# Patient Record
Sex: Female | Born: 1935
Health system: Southern US, Community
[De-identification: ages and names within clinical notes are randomized; demographics above are authoritative.]

## PROBLEM LIST (undated history)

## (undated) DIAGNOSIS — C50919 Malignant neoplasm of unspecified site of unspecified female breast: Secondary | ICD-10-CM

## (undated) DIAGNOSIS — I10 Essential (primary) hypertension: Secondary | ICD-10-CM

## (undated) DIAGNOSIS — R413 Other amnesia: Secondary | ICD-10-CM

## (undated) DIAGNOSIS — G8929 Other chronic pain: Secondary | ICD-10-CM

## (undated) DIAGNOSIS — M545 Low back pain, unspecified: Secondary | ICD-10-CM

## (undated) DIAGNOSIS — C9110 Chronic lymphocytic leukemia of B-cell type not having achieved remission: Secondary | ICD-10-CM

## (undated) DIAGNOSIS — E860 Dehydration: Secondary | ICD-10-CM

## (undated) HISTORY — DX: Chronic lymphocytic leukemia of B-cell type not having achieved remission: C91.10

## (undated) HISTORY — DX: Other chronic pain: G89.29

## (undated) HISTORY — DX: Low back pain, unspecified: M54.50

## (undated) HISTORY — PX: MASTECTOMY: SHX3

## (undated) HISTORY — DX: Essential (primary) hypertension: I10

---

## 2019-03-18 DIAGNOSIS — S3210XA Unspecified fracture of sacrum, initial encounter for closed fracture: Secondary | ICD-10-CM

## 2019-03-18 HISTORY — DX: Unspecified fracture of sacrum, initial encounter for closed fracture: S32.10XA

## 2019-04-08 ENCOUNTER — Other Ambulatory Visit: Payer: Self-pay

## 2019-04-08 ENCOUNTER — Emergency Department (HOSPITAL_BASED_OUTPATIENT_CLINIC_OR_DEPARTMENT_OTHER)
Admission: EM | Admit: 2019-04-08 | Discharge: 2019-04-08 | Disposition: A | Discharge status: Home / Self Care | Payer: Medicare Other | Attending: Emergency Medicine | Admitting: Emergency Medicine

## 2019-04-08 ENCOUNTER — Emergency Department (HOSPITAL_BASED_OUTPATIENT_CLINIC_OR_DEPARTMENT_OTHER): Payer: Medicare Other

## 2019-04-08 ENCOUNTER — Encounter (HOSPITAL_BASED_OUTPATIENT_CLINIC_OR_DEPARTMENT_OTHER): Payer: Self-pay

## 2019-04-08 DIAGNOSIS — Z9010 Acquired absence of unspecified breast and nipple: Secondary | ICD-10-CM | POA: Diagnosis not present

## 2019-04-08 DIAGNOSIS — Y929 Unspecified place or not applicable: Secondary | ICD-10-CM | POA: Diagnosis not present

## 2019-04-08 DIAGNOSIS — S3992XA Unspecified injury of lower back, initial encounter: Secondary | ICD-10-CM | POA: Insufficient documentation

## 2019-04-08 DIAGNOSIS — Z853 Personal history of malignant neoplasm of breast: Secondary | ICD-10-CM | POA: Insufficient documentation

## 2019-04-08 DIAGNOSIS — Y999 Unspecified external cause status: Secondary | ICD-10-CM | POA: Insufficient documentation

## 2019-04-08 DIAGNOSIS — Y9301 Activity, walking, marching and hiking: Secondary | ICD-10-CM | POA: Diagnosis not present

## 2019-04-08 DIAGNOSIS — W010XXA Fall on same level from slipping, tripping and stumbling without subsequent striking against object, initial encounter: Secondary | ICD-10-CM | POA: Insufficient documentation

## 2019-04-08 HISTORY — DX: Malignant neoplasm of unspecified site of unspecified female breast: C50.919

## 2019-04-08 HISTORY — DX: Other amnesia: R41.3

## 2019-04-08 HISTORY — DX: Dehydration: E86.0

## 2019-04-08 MED ORDER — HYDROCODONE-ACETAMINOPHEN 5-325 MG PO TABS: 1.0000 | ORAL_TABLET | Freq: Four times a day (QID) | ORAL | 0 refills | Status: DC | PRN

## 2019-04-08 NOTE — ED Notes (Signed)
Provider at bedside to discuss results.

## 2019-04-08 NOTE — ED Notes (Signed)
Patient transported to CT 

## 2019-04-08 NOTE — ED Notes (Signed)
Assisted to bathroom via wheelchair prior to discharge.  Pt utilizing personal wheelchair and inflatable donut cusion

## 2019-04-08 NOTE — ED Provider Notes (Signed)
[facility] EMERGENCY DEPARTMENT Provider Note   CSN: [id number] Arrival date & time: 04/08/19  1437     History   Chief Complaint Chief Complaint  Patient presents with  . Fall    HPI Joan Hill is a 85 y.o. female brought in by her son-in-law.  Patient was carrying her plates when she turned and fell backward onto her tailbone.  This happened approximately 2 and half weeks ago.  Since that time she has had significant pain anytime she moves or changes position.  She is better at rest.  She has been able to ambulate but is complaining of pain in her tailbone and low back.  She has no new weakness in the lower extremities.     HPI  Past Medical History:  Diagnosis Date  . Breast cancer ([place])   . Dehydration   . Memory loss     There are no active problems to display for this patient.   Past Surgical History:  Procedure Laterality Date  . MASTECTOMY       OB History   No obstetric history on file.      Home Medications    Prior to Admission medications   Not on File    Family History No family history on file.  Social History Social History   Tobacco Use  . Smoking status: Never Smoker  . Smokeless tobacco: Never Used  Substance Use Topics  . Alcohol use: Never    Frequency: Never  . Drug use: Never     Allergies   Patient has no known allergies.   Review of Systems Review of Systems  Unable to perform ROS: Dementia      Physical Exam Updated Vital Signs BP 110/61 (BP Location: Left Arm)   Pulse 97   Temp 98.8 F (37.1 C) (Oral)   Resp 20   SpO2 94%   Physical Exam Vitals signs and nursing note reviewed.  Constitutional:      General: She is not in acute distress.    Appearance: She is well-developed. She is not diaphoretic.  HENT:     Head: Normocephalic and atraumatic.  Eyes:     General: No scleral icterus.    Conjunctiva/sclera: Conjunctivae normal.  Neck:     Musculoskeletal: Normal range of motion.   Cardiovascular:     Rate and Rhythm: Normal rate and regular rhythm.     Heart sounds: Normal heart sounds. No murmur. No friction rub. No gallop.   Pulmonary:     Effort: Pulmonary effort is normal. No respiratory distress.     Breath sounds: Normal breath sounds.  Abdominal:     General: Bowel sounds are normal. There is no distension.     Palpations: Abdomen is soft. There is no mass.     Tenderness: There is no abdominal tenderness. There is no guarding.  Musculoskeletal:     Comments: Normal strength of the bilateral lower extremities.  No pain with internal or external rotation of the hips bilaterally.  Pain with pressure on the iliac crests.  Tenderness over the right SI joint, no tenderness over the sacrum or midline spinal tenderness, tenderness over the right lumbar paraspinals.  Skin:    General: Skin is warm and dry.  Neurological:     Mental Status: She is alert and oriented to person, place, and time.  Psychiatric:        Behavior: Behavior normal.      ED Treatments / Results  Labs (all  labs ordered are listed, but only abnormal results are displayed) Labs Reviewed - No data to display  EKG None  Radiology No results found.  Procedures Procedures (including critical care time)  Medications Ordered in ED Medications - No data to display   Initial Impression / Assessment and Plan / ED Course  I have reviewed the triage vital signs and the nursing notes.  Pertinent labs & imaging results that were available during my care of the patient were reviewed by me and considered in my medical decision making (see chart for details).        85 year old female here with complaint of pain after fall onto her sacrum.  I personally reviewed the hip, lumbar spine and sacral and coccyx plain films which shows a sacral fracture on the lateral view by my interpretation.  I also ordered a CT as recommended by radiology which again confirms a sacral fracture by my  interpretation.  Patient has a walker and a wheelchair at home.  Her son-in-law has asked that we get her better pain control.  I have ordered Norco, I have suggested that she gets stool softeners.  She is to follow closely with her primary care doctor.  Final Clinical Impressions(s) / ED Diagnoses   Final diagnoses:  None    ED Discharge Orders    None       Margarita Mail, PA-C 04/08/19 1842    Virgel Manifold, MD 04/09/19 1005

## 2019-04-08 NOTE — Discharge Instructions (Addendum)
Contact a health care provider if: Your pain becomes worse or is not controlled with medicine. Your bowel movements cause a great deal of discomfort. You are unable to have a bowel movement after 4 days. You have pain during intercourse. 

## 2019-04-08 NOTE — ED Triage Notes (Signed)
Per son-in-law pt fell the first week of Oct-"backwards onto tailbone"-has not sought medical tx-pt to triage in w/c

## 2019-04-14 ENCOUNTER — Telehealth: Payer: Self-pay | Admitting: Family

## 2019-04-14 NOTE — Telephone Encounter (Signed)
Copied from [place] [phone]. Topic: General - Other >> Apr 14, 2019 12:11   Alanda Slim E wrote: Reason for CRM: Henrietta Dine called and asked about the Pts HYDROcodone-acetaminophen (NORCO) 5-325 MG tablet   that was given by the med center when she was given a CT scan for a fractured tail bone? Pt will run out of medication before her next appt with Melissa and he is asking if she can receive a refill/ please advise

## 2019-04-14 NOTE — Telephone Encounter (Signed)
I can't prescribe a narcotic without seeing her first per [place] prescribing laws. Ok to try to move her appointment up sooner though.

## 2019-04-15 NOTE — Telephone Encounter (Signed)
Called patient again, still no answer.  

## 2019-04-15 NOTE — Telephone Encounter (Signed)
Called number listed for patient several times but no answer and no voice mail. No MyChart account set up either.  Will try again later.

## 2019-04-16 NOTE — Telephone Encounter (Signed)
Called patient again, still no answer, she has not called back about this matter either.

## 2019-04-20 ENCOUNTER — Other Ambulatory Visit: Payer: Self-pay

## 2019-04-21 ENCOUNTER — Encounter: Payer: Self-pay | Admitting: Family

## 2019-04-21 ENCOUNTER — Telehealth: Payer: Self-pay | Admitting: *Deleted

## 2019-04-21 ENCOUNTER — Ambulatory Visit (INDEPENDENT_AMBULATORY_CARE_PROVIDER_SITE_OTHER): Payer: Medicare Other | Admitting: Family

## 2019-04-21 VITALS — BP 106/59 | HR 94 | Temp 97.3°F | Resp 16 | Ht 64.0 in | Wt 113.0 lb

## 2019-04-21 DIAGNOSIS — I10 Essential (primary) hypertension: Secondary | ICD-10-CM

## 2019-04-21 DIAGNOSIS — G894 Chronic pain syndrome: Secondary | ICD-10-CM

## 2019-04-21 DIAGNOSIS — M545 Low back pain, unspecified: Secondary | ICD-10-CM

## 2019-04-21 DIAGNOSIS — C9110 Chronic lymphocytic leukemia of B-cell type not having achieved remission: Secondary | ICD-10-CM

## 2019-04-21 DIAGNOSIS — R413 Other amnesia: Secondary | ICD-10-CM

## 2019-04-21 DIAGNOSIS — S3210XD Unspecified fracture of sacrum, subsequent encounter for fracture with routine healing: Secondary | ICD-10-CM

## 2019-04-21 DIAGNOSIS — G8929 Other chronic pain: Secondary | ICD-10-CM

## 2019-04-21 LAB — CBC WITH DIFFERENTIAL/PLATELET
Basophils Absolute: 0.1 10*3/uL (ref 0.0–0.1)
Basophils Relative: 0.4 % (ref 0.0–3.0)
Eosinophils Absolute: 0.2 10*3/uL (ref 0.0–0.7)
Eosinophils Relative: 0.8 % (ref 0.0–5.0)
HCT: 35.3 % — ABNORMAL LOW (ref 36.0–46.0)
Hemoglobin: 11.1 g/dL — ABNORMAL LOW (ref 12.0–15.0)
Lymphocytes Relative: 63.9 % — ABNORMAL HIGH (ref 12.0–46.0)
Lymphs Abs: 20.5 10*3/uL — ABNORMAL HIGH (ref 0.7–4.0)
MCHC: 31.4 g/dL (ref 30.0–36.0)
MCV: 93 fl (ref 78.0–100.0)
Monocytes Absolute: 1.3 10*3/uL — ABNORMAL HIGH (ref 0.1–1.0)
Monocytes Relative: 4 % (ref 3.0–12.0)
Neutro Abs: 9.9 10*3/uL — ABNORMAL HIGH (ref 1.4–7.7)
Neutrophils Relative %: 30.9 % — ABNORMAL LOW (ref 43.0–77.0)
Platelets: 226 10*3/uL (ref 150.0–400.0)
RBC: 3.8 Mil/uL — ABNORMAL LOW (ref 3.87–5.11)
RDW: 15.4 % (ref 11.5–15.5)
WBC: 32.1 10*3/uL (ref 4.0–10.5)

## 2019-04-21 LAB — COMPREHENSIVE METABOLIC PANEL
ALT: 12 U/L (ref 0–35)
AST: 14 U/L (ref 0–37)
Albumin: 3.4 g/dL — ABNORMAL LOW (ref 3.5–5.2)
Alkaline Phosphatase: 102 U/L (ref 39–117)
BUN: 21 mg/dL (ref 6–23)
CO2: 28 mEq/L (ref 19–32)
Calcium: 9 mg/dL (ref 8.4–10.5)
Chloride: 103 mEq/L (ref 96–112)
Creatinine, Ser: 0.8 mg/dL (ref 0.40–1.20)
GFR: 67.52 mL/min (ref >60.00)
Glucose, Bld: 117 mg/dL — ABNORMAL HIGH (ref 70–99)
Potassium: 4.4 mEq/L (ref 3.5–5.1)
Sodium: 136 mEq/L (ref 135–145)
Total Bilirubin: 0.3 mg/dL (ref 0.2–1.2)
Total Protein: 6.3 g/dL (ref 6.0–8.3)

## 2019-04-21 MED ORDER — TRAMADOL HCL 50 MG PO TABS: 100.0000 mg | ORAL_TABLET | Freq: Two times a day (BID) | ORAL | 0 refills | Status: DC

## 2019-04-21 NOTE — Patient Instructions (Addendum)
Please complete lab work prior to leaving.  Stop Ramipril. Welcome to [facility]!

## 2019-04-21 NOTE — Progress Notes (Signed)
Subjective:    Patient ID: Joan Hill, female    DOB: [birth year], 85 y.o.   MRN: [id number]  HPI  Patient is an 85 year old female who presents today to establish care.  She was previously followed by Dr. Drake Leach at another practice.  She is accompanied today by her son-in-law who along with her daughter serves as her caregivers.  She lives with them in their home and has lived with them for the last 3 years.  Son notes that patient has issues with memory.  1 month ago she had a fall at home.  Apparently she was turning and lost her balance.  She fell on her coccyx.  She had severe pain with walking and sitting following this injury.  Her son-in-law brought her to the med center ED for further evaluation where they performed a CT scan of her sacrum which noted an acute sacral fracture.  Initially she was having significant pain which was requiring hydrocodone.  Son-in-law reports that her pain is now significantly improved.  She is off of the hydrocodone and is on tramadol 100 mg twice daily which is a long-term medicine that she takes for chronic pain in her lower back.  He states that her pain originates from history of scoliosis.  They are also alternating Tylenol with Motrin as needed for pain.  She is able to ambulate short distances with a cane and supervision.  She complains of dizziness in the AM.   BP Readings from Last 3 Encounters:  04/21/19 (!) 106/59  04/08/19 120/68   CLL- sees hematology.  She has not seen them since 2018.   Memory loss- per son-in-law, this is at baseline.   Review of Systems  Constitutional: Negative for unexpected weight change.  HENT: Negative for hearing loss and rhinorrhea.   Eyes: Negative for visual disturbance.  Respiratory: Negative for cough and shortness of breath.   Cardiovascular: Negative for chest pain.  Gastrointestinal: Negative for constipation and diarrhea.  Genitourinary: Negative for dysuria and frequency.   Musculoskeletal: Positive for back pain.  Neurological: Negative for headaches.  Hematological: Negative for adenopathy.  Psychiatric/Behavioral:       Denies depression/anxiety   Past Medical History:  Diagnosis Date  . Breast cancer ([place])   . Dehydration   . Memory loss      Social History   Socioeconomic History  . Marital status: Widowed    Spouse name: Not on file  . Number of children: Not on file  . Years of education: Not on file  . Highest education level: Not on file  Occupational History  . Not on file  Social Needs  . Financial resource strain: Not on file  . Food insecurity    Worry: Not on file    Inability: Not on file  . Transportation needs    Medical: Not on file    Non-medical: Not on file  Tobacco Use  . Smoking status: Never Smoker  . Smokeless tobacco: Never Used  Substance and Sexual Activity  . Alcohol use: Not Currently    Frequency: Never  . Drug use: Never  . Sexual activity: Not Currently  Lifestyle  . Physical activity    Days per week: Not on file    Minutes per session: Not on file  . Stress: Not on file  Relationships  . Social Herbalist on phone: Not on file    Gets together: Not on file    Attends religious service: Not  on file    Active member of club or organization: Not on file    Attends meetings of clubs or organizations: Not on file    Relationship status: Not on file  . Intimate partner violence    Fear of current or ex partner: Not on file    Emotionally abused: Not on file    Physically abused: Not on file    Forced sexual activity: Not on file  Other Topics Concern  . Not on file  Social History Narrative   Lives with daughter and son in law   Has 2 daughters and 2 sons, oldest son is deceased, other children live locally.     Past Surgical History:  Procedure Laterality Date  . MASTECTOMY      History reviewed. No pertinent family history.  No Known Allergies  Current Outpatient  Medications on File Prior to Visit  Medication Sig Dispense Refill  . traMADol (ULTRAM) 50 MG tablet TAKE 2 TABLETS BY MOUTH IN THE MORNING AND 2 TABLETS AT BEDTIME     No current facility-administered medications on file prior to visit.     BP (!) 106/59 (BP Location: Left Arm, Patient Position: Sitting, Cuff Size: Small)   Pulse 94   Temp (!) 97.3 F (36.3 C) (Temporal)   Resp 16   Ht 5\' 4"  (1.626 m)   Wt 113 lb (51.3 kg)   SpO2 94%   BMI 19.40 kg/m        Objective:   Physical Exam Constitutional:      Appearance: She is well-developed.  HENT:     Head: Normocephalic and atraumatic.  Eyes:     Conjunctiva/sclera: Conjunctivae normal.  Neck:     Musculoskeletal: Neck supple.     Thyroid: No thyromegaly.  Cardiovascular:     Rate and Rhythm: Normal rate and regular rhythm.     Heart sounds: Normal heart sounds. No murmur.  Pulmonary:     Effort: Pulmonary effort is normal. No respiratory distress.     Breath sounds: Normal breath sounds. No wheezing.  Abdominal:     General: Bowel sounds are normal.     Palpations: Abdomen is soft.     Tenderness: There is no abdominal tenderness.  Musculoskeletal:     Right lower leg: 1+ Edema present.     Left lower leg: 2+ Edema present.  Lymphadenopathy:     Cervical: No cervical adenopathy.  Skin:    General: Skin is warm and dry.  Neurological:     Mental Status: She is alert.     Comments: Some difficulty hearing Memory seems impaired.   Psychiatric:        Mood and Affect: Mood normal.        Speech: Speech normal.        Behavior: Behavior normal.        Thought Content: Thought content normal.        Judgment: Judgment normal.           Assessment & Plan:  CLL- WBC up compared to baseline when I reviewed records from [facility].  I reviewed the case with her hematologist, Dr. Baird Cancer.  He is not overly concerned as she is feeling well.  He would like to get her in to be seen this month and states his  office will work on getting her scheduled.   Lab Results  Component Value Date   WBC 32.1 Repeated and verified X2. (HH) 04/21/2019   HGB 11.1 (  L) 04/21/2019   HCT 35.3 (L) 04/21/2019   MCV 93.0 04/21/2019   PLT 226.0 04/21/2019   Sacral fracture- pain is improved.  She is ambulating with assistance.  Monitor.   Chronic low back pain- reviewed [place] Controlled substance registry, UDS will be performed and controlled substance contract signed.  Continue tramadol 100mg  bid.    HTN- overtreated.  I am concerned that her she may be having some orthostasis which is contributing to her dizziness and falls. Will d/c ramipril.  Memory loss- at baseline, could consider referral to neurology at some point if patient and family are interested.

## 2019-04-21 NOTE — Telephone Encounter (Signed)
CRITICAL VALUE STICKER  CRITICAL VALUE:  WBC  32.1  RECEIVER (on-site recipient of call):  Kelle Darting, [place] NOTIFIED:  04/21/19 @ 4:07pm  MESSENGER (representative from lab):  Shari Prows.  MD NOTIFIED:  Lenna Sciara / Rod Holler  TIME OF NOTIFICATION: 4:07pm  RESPONSE:

## 2019-04-22 ENCOUNTER — Telehealth: Payer: Self-pay | Admitting: Family

## 2019-04-22 ENCOUNTER — Encounter: Payer: Self-pay | Admitting: Family

## 2019-04-22 DIAGNOSIS — G8929 Other chronic pain: Secondary | ICD-10-CM | POA: Insufficient documentation

## 2019-04-22 DIAGNOSIS — R413 Other amnesia: Secondary | ICD-10-CM | POA: Insufficient documentation

## 2019-04-22 DIAGNOSIS — I10 Essential (primary) hypertension: Secondary | ICD-10-CM | POA: Insufficient documentation

## 2019-04-22 DIAGNOSIS — C9110 Chronic lymphocytic leukemia of B-cell type not having achieved remission: Secondary | ICD-10-CM | POA: Insufficient documentation

## 2019-04-22 NOTE — Telephone Encounter (Signed)
Please contact pt's son in law and let him know that her white blood cell count is up. I spoke with Dr.George Baird Cancer her hematologist and he would like to get her back in to be seen this month.  His office should be reaching out to them to arrange. If they don't hear from them please ask them to call his office.

## 2019-04-22 NOTE — Telephone Encounter (Signed)
Results reviewed with her hematologist.

## 2019-04-22 NOTE — Telephone Encounter (Signed)
Patient's family made aware of results and discussion with hematology. They will be aware of hematology calling for appointment

## 2019-04-22 NOTE — Telephone Encounter (Signed)
Spoke to patient's son in [place] and advised him of abnormal results. He was made aware he will receive a call from hematology for appointment and to call them if no call by tomorrow.

## 2019-04-24 LAB — DRUG TOX MONITOR 1 W/CONF, ORAL FLD

## 2019-05-23 ENCOUNTER — Other Ambulatory Visit: Payer: Self-pay | Admitting: Family

## 2019-05-26 ENCOUNTER — Ambulatory Visit: Payer: Medicare Other | Admitting: Family

## 2019-05-27 ENCOUNTER — Other Ambulatory Visit: Payer: Self-pay

## 2019-05-28 ENCOUNTER — Ambulatory Visit (INDEPENDENT_AMBULATORY_CARE_PROVIDER_SITE_OTHER): Payer: Medicare Other | Admitting: Family

## 2019-05-28 DIAGNOSIS — C9110 Chronic lymphocytic leukemia of B-cell type not having achieved remission: Secondary | ICD-10-CM

## 2019-05-28 DIAGNOSIS — I10 Essential (primary) hypertension: Secondary | ICD-10-CM

## 2019-05-28 DIAGNOSIS — G8929 Other chronic pain: Secondary | ICD-10-CM

## 2019-05-28 DIAGNOSIS — M545 Low back pain, unspecified: Secondary | ICD-10-CM

## 2019-05-28 NOTE — Progress Notes (Signed)
Virtual Visit via Video Note  I connected with Aroush Chasse on 05/28/19 at  4:00 PM EST by a video enabled telemedicine application and verified that I am speaking with the correct person using two identifiers.  Location: Patient: home Provider: home   I discussed the limitations of evaluation and management by telemedicine and the availability of in person appointments. The patient expressed understanding and agreed to proceed.  History of Present Illness:  Patient is an 85 yr old female who presents today for follow up. We met her for the first time last visit. She is with her daughter-in-law for today's visit.   CLL- Last visit her wbc was noted to be elevated and was reviewed with her hematologist.  She saw him in follow up on 05/26/19. WBC at that visit was 24K.  He recommended a 6 month follow up as she was asymptomatic.   HTN- Last visit her bp appeared overtreated (she was experiencing dizziness and falls). We discontinued ramipril.  (bp 124/69) on 12/9 at her hematologist's office.  BP Readings from Last 3 Encounters:  04/21/19 (!) 106/59  04/08/19 120/68   Chronic low back pain- continue tramadol 100 mg bid.   Past Medical History:  Diagnosis Date  . Breast cancer ([place])   . Chronic low back pain   . CLL (chronic lymphocytic leukemia) ([place])   . Dehydration   . Hypertension   . Memory loss   . Sacral fracture ([place]) 03/2019     Social History   Socioeconomic History  . Marital status: Widowed    Spouse name: Not on file  . Number of children: Not on file  . Years of education: Not on file  . Highest education level: Not on file  Occupational History  . Not on file  Tobacco Use  . Smoking status: Never Smoker  . Smokeless tobacco: Never Used  Substance and Sexual Activity  . Alcohol use: Not Currently  . Drug use: Never  . Sexual activity: Not Currently  Other Topics Concern  . Not on file  Social History Narrative   Lives with daughter and son in law   Has 2 daughters and 2 sons, oldest son is deceased, other children live locally.    Social Determinants of Health   Financial Resource Strain:   . Difficulty of Paying Living Expenses: Not on file  Food Insecurity:   . Worried About Charity fundraiser in the Last Year: Not on file  . Ran Out of Food in the Last Year: Not on file  Transportation Needs:   . Lack of Transportation (Medical): Not on file  . Lack of Transportation (Non-Medical): Not on file  Physical Activity:   . Days of Exercise per Week: Not on file  . Minutes of Exercise per Session: Not on file  Stress:   . Feeling of Stress : Not on file  Social Connections:   . Frequency of Communication with Friends and Family: Not on file  . Frequency of Social Gatherings with Friends and Family: Not on file  . Attends Religious Services: Not on file  . Active Member of Clubs or Organizations: Not on file  . Attends Archivist Meetings: Not on file  . Marital Status: Not on file  Intimate Partner Violence:   . Fear of Current or Ex-Partner: Not on file  . Emotionally Abused: Not on file  . Physically Abused: Not on file  . Sexually Abused: Not on file    Past Surgical  History:  Procedure Laterality Date  . MASTECTOMY      No family history on file.  No Known Allergies  Current Outpatient Medications on File Prior to Visit  Medication Sig Dispense Refill  . traMADol (ULTRAM) 50 MG tablet TAKE 2 TABLETS (100 MG TOTAL) BY MOUTH 2 (TWO) TIMES DAILY. 120 tablet 0   No current facility-administered medications on file prior to visit.    There were no vitals taken for this visit.     Observations/Objective:   Gen: Awake, alert, no acute distress Resp: Breathing is even and non-labored Psych: calm/pleasant demeanor Neuro: Alert and Oriented x 3, + facial symmetry, speech is clear.   Assessment and Plan:  CLL- clinically stable. Following with hematology.  Chronic back pain- stable on tramadol,  continue dame.  HTN- bp looks improved off of ramapril, continue off of ramapril.   Follow Up Instructions:    I discussed the assessment and treatment plan with the patient. The patient was provided an opportunity to ask questions and all were answered. The patient agreed with the plan and demonstrated an understanding of the instructions.   The patient was advised to call back or seek an in-person evaluation if the symptoms worsen or if the condition fails to improve as anticipated.  Nance Pear, NP

## 2019-06-26 ENCOUNTER — Other Ambulatory Visit: Payer: Self-pay | Admitting: Family

## 2019-07-25 ENCOUNTER — Other Ambulatory Visit: Payer: Self-pay | Admitting: Family

## 2019-07-27 NOTE — Telephone Encounter (Signed)
Last Tramadol RX:  06/28/19, #120 Last OV:  05/27/20 Next OV:  Not scheduled UDS:  04/21/19 CSC:  04/21/19

## 2019-08-26 ENCOUNTER — Other Ambulatory Visit: Payer: Self-pay | Admitting: Family

## 2019-08-26 NOTE — Telephone Encounter (Signed)
Requesting:Tramadol Contract: 05/19/2019 UDS:04/21/2019 Last Visit:05/28/2019 Next Visit:none Last Refill:07/28/2019  Please Advise

## 2019-09-26 ENCOUNTER — Other Ambulatory Visit: Payer: Self-pay | Admitting: Family

## 2019-10-30 ENCOUNTER — Other Ambulatory Visit: Payer: Self-pay | Admitting: Family

## 2019-11-29 ENCOUNTER — Telehealth: Payer: Self-pay | Admitting: Family

## 2019-11-29 NOTE — Telephone Encounter (Signed)
Lvm for patient to call back about refill and to schedule follow up appointment

## 2019-11-29 NOTE — Telephone Encounter (Signed)
Patient is requesting extra refills.  Medication: traMADol (ULTRAM) 50 MG tablet [[id number]]      Has the patient contacted their pharmacy? yes (If no, request that the patient contact the pharmacy for the refill.) (If yes, when and what did the pharmacy advise?) contact doctor     Preferred Pharmacy (with phone number or street name): [facility], [place], [place] [address]  Phone:  [phone] Fax:  [phone]      Agent: Please be advised that RX refills may take up to 3 business days. We ask that you follow-up with your pharmacy.

## 2019-11-30 ENCOUNTER — Telehealth: Payer: Self-pay

## 2019-11-30 MED ORDER — TRAMADOL HCL 50 MG PO TABS: 100.0000 mg | ORAL_TABLET | Freq: Two times a day (BID) | ORAL | 0 refills | Status: DC

## 2019-11-30 NOTE — Telephone Encounter (Signed)
Spoke to patient's son Waunita Schooner, patient was scheduled for follow up 12-14-19

## 2019-11-30 NOTE — Telephone Encounter (Signed)
Caller states the nurse- Rod Holler called about his mother in laws medication /refills -- but left no more details -- caller stats she sees the PA [place] states he is on his way into a meeting and it will be late/ we can callback in the morning

## 2019-11-30 NOTE — Telephone Encounter (Signed)
Spoke to patient's son Joan Hill and appointment was set up for 12-14-19

## 2019-11-30 NOTE — Telephone Encounter (Signed)
Refill sent, however she is due for follow up. Please schedule follow up visit.

## 2019-12-14 ENCOUNTER — Other Ambulatory Visit: Payer: Self-pay

## 2019-12-14 ENCOUNTER — Encounter: Payer: Self-pay | Admitting: Family

## 2019-12-14 ENCOUNTER — Ambulatory Visit (INDEPENDENT_AMBULATORY_CARE_PROVIDER_SITE_OTHER): Payer: Medicare Other | Admitting: Family

## 2019-12-14 VITALS — BP 145/66 | HR 94 | Temp 97.8°F | Resp 16 | Ht 64.0 in | Wt 113.0 lb

## 2019-12-14 DIAGNOSIS — G8929 Other chronic pain: Secondary | ICD-10-CM

## 2019-12-14 DIAGNOSIS — M549 Dorsalgia, unspecified: Secondary | ICD-10-CM

## 2019-12-14 NOTE — Progress Notes (Signed)
Subjective:    Patient ID: Joan Hill, female    DOB: [birth year], 86 y.o.   MRN: [id number]  HPI  Patient is an 86 yr old female who presents today for follow up of her chronic back pain.   Daughter reports that patient's pain is on/off.  She uses tramadol 100mg  twice daily.  Patient has dementia and but when I ask her if she is having any pain she says "I have no pain at all."     Review of Systems    see HPI  Past Medical History:  Diagnosis Date  . Breast cancer ([place])   . Chronic low back pain   . CLL (chronic lymphocytic leukemia) ([place])   . Dehydration   . Hypertension   . Memory loss   . Sacral fracture ([place]) 03/2019     Social History   Socioeconomic History  . Marital status: Widowed    Spouse name: Not on file  . Number of children: Not on file  . Years of education: Not on file  . Highest education level: Not on file  Occupational History  . Not on file  Tobacco Use  . Smoking status: Never Smoker  . Smokeless tobacco: Never Used  Vaping Use  . Vaping Use: Never used  Substance and Sexual Activity  . Alcohol use: Not Currently  . Drug use: Never  . Sexual activity: Not Currently  Other Topics Concern  . Not on file  Social History Narrative   Lives with daughter and son in law   Has 2 daughters and 2 sons, oldest son is deceased, other children live locally.    Social Determinants of Health   Financial Resource Strain:   . Difficulty of Paying Living Expenses:   Food Insecurity:   . Worried About Charity fundraiser in the Last Year:   . Arboriculturist in the Last Year:   Transportation Needs:   . Film/video editor (Medical):   Marland Kitchen Lack of Transportation (Non-Medical):   Physical Activity:   . Days of Exercise per Week:   . Minutes of Exercise per Session:   Stress:   . Feeling of Stress :   Social Connections:   . Frequency of Communication with Friends and Family:   . Frequency of Social Gatherings with Friends and Family:   .  Attends Religious Services:   . Active Member of Clubs or Organizations:   . Attends Archivist Meetings:   Marland Kitchen Marital Status:   Intimate Partner Violence:   . Fear of Current or Ex-Partner:   . Emotionally Abused:   Marland Kitchen Physically Abused:   . Sexually Abused:     Past Surgical History:  Procedure Laterality Date  . MASTECTOMY      No family history on file.  No Known Allergies  Current Outpatient Medications on File Prior to Visit  Medication Sig Dispense Refill  . traMADol (ULTRAM) 50 MG tablet Take 2 tablets (100 mg total) by mouth 2 (two) times daily. 120 tablet 0   No current facility-administered medications on file prior to visit.    BP (!) 145/66 (BP Location: Right Arm, Patient Position: Sitting, Cuff Size: Small)   Pulse 94   Temp 97.8 F (36.6 C) (Temporal)   Resp 16   Ht 5\' 4"  (1.626 m)   Wt 113 lb (51.3 kg)   SpO2 96%   BMI 19.40 kg/m    Objective:   Physical Exam Constitutional:  Appearance: She is well-developed.  Cardiovascular:     Rate and Rhythm: Normal rate and regular rhythm.     Heart sounds: Normal heart sounds. No murmur heard.   Pulmonary:     Effort: Pulmonary effort is normal. No respiratory distress.     Breath sounds: Normal breath sounds. No wheezing.  Psychiatric:        Behavior: Behavior normal.        Thought Content: Thought content normal.        Judgment: Judgment normal.           Assessment & Plan:  Chronic back pain- I am no sure how much she is needing the pain medicine at this point. Discussed the following with her daughter:  Please decrease tramadol to 1 tablet twice daily.  Try this for a few weeks. If no pain, please change to 1 tab twice daily as needed.   This visit occurred during the SARS-CoV-2 public health emergency.  Safety protocols were in place, including screening questions prior to the visit, additional usage of staff PPE, and extensive cleaning of exam room while observing  appropriate contact time as indicated for disinfecting solutions.

## 2019-12-14 NOTE — Patient Instructions (Signed)
Please decrease tramadol to 1 tablet twice daily.  Try this for a few weeks. If no pain, please change to 1 tab twice daily as needed.

## 2019-12-26 ENCOUNTER — Other Ambulatory Visit: Payer: Self-pay | Admitting: Family

## 2019-12-27 NOTE — Telephone Encounter (Signed)
Refill sent.  Please ask family how she is doing on the 1 tab of tramadol bid (pain wise).

## 2019-12-27 NOTE — Telephone Encounter (Signed)
Per Pharmacy, Patient is not doing well with decreased dosage. Pt would like to go back to regular dosage.   Please Advise

## 2019-12-28 MED ORDER — TRAMADOL HCL 50 MG PO TABS: 100.0000 mg | ORAL_TABLET | Freq: Two times a day (BID) | ORAL | 0 refills | Status: DC

## 2019-12-28 NOTE — Telephone Encounter (Signed)
LM requesting call back to discuss medication change/dosage.

## 2019-12-28 NOTE — Telephone Encounter (Signed)
rx resent with updated instructions and message to pharmacy to cancel yesterday's rx.  I confirmed in [place] aware that yesterday's rx has not yet been filled.

## 2019-12-28 NOTE — Telephone Encounter (Signed)
Spoke with daughter, Arrie Aran.  Dawn states patient decreased dose of Tramadol 50mg  to one tablet twice daily for 2 days, but was unable to ambulate d/t pain. Daughter has increased back to original dose of 2 tablets twice a day, therefore needs prescription/refill changed if possible.

## 2020-01-29 ENCOUNTER — Other Ambulatory Visit: Payer: Self-pay | Admitting: Family

## 2020-02-28 ENCOUNTER — Other Ambulatory Visit: Payer: Self-pay | Admitting: Family

## 2020-03-17 ENCOUNTER — Ambulatory Visit: Payer: Medicare Other | Admitting: Family

## 2020-03-21 ENCOUNTER — Other Ambulatory Visit: Payer: Self-pay

## 2020-03-21 ENCOUNTER — Ambulatory Visit (INDEPENDENT_AMBULATORY_CARE_PROVIDER_SITE_OTHER): Payer: Medicare Other | Admitting: Family

## 2020-03-21 ENCOUNTER — Encounter: Payer: Self-pay | Admitting: Family

## 2020-03-21 VITALS — BP 128/62 | HR 101 | Temp 98.1°F | Resp 16 | Ht 64.0 in | Wt 109.0 lb

## 2020-03-21 DIAGNOSIS — M545 Low back pain, unspecified: Secondary | ICD-10-CM

## 2020-03-21 DIAGNOSIS — Z23 Encounter for immunization: Secondary | ICD-10-CM

## 2020-03-21 DIAGNOSIS — C9110 Chronic lymphocytic leukemia of B-cell type not having achieved remission: Secondary | ICD-10-CM

## 2020-03-21 DIAGNOSIS — G309 Alzheimer's disease, unspecified: Secondary | ICD-10-CM

## 2020-03-21 DIAGNOSIS — F0280 Dementia in other diseases classified elsewhere without behavioral disturbance: Secondary | ICD-10-CM | POA: Diagnosis not present

## 2020-03-21 DIAGNOSIS — G8929 Other chronic pain: Secondary | ICD-10-CM

## 2020-03-21 DIAGNOSIS — M549 Dorsalgia, unspecified: Secondary | ICD-10-CM

## 2020-03-21 LAB — CBC WITH DIFFERENTIAL/PLATELET
Absolute Monocytes: 2100 cells/uL — ABNORMAL HIGH (ref 200–950)
Basophils Absolute: 60 cells/uL (ref 0–200)
Basophils Relative: 0.2 %
Eosinophils Absolute: 210 cells/uL (ref 15–500)
Eosinophils Relative: 0.7 %
HCT: 39.4 % (ref 35.0–45.0)
Hemoglobin: 12.2 g/dL (ref 11.7–15.5)
Lymphs Abs: 19380 cells/uL — ABNORMAL HIGH (ref 850–3900)
MCH: 27.8 pg (ref 27.0–33.0)
MCHC: 31 g/dL — ABNORMAL LOW (ref 32.0–36.0)
MCV: 89.7 fL (ref 80.0–100.0)
MPV: 10.2 fL (ref 7.5–12.5)
Monocytes Relative: 7 %
Neutro Abs: 8250 cells/uL — ABNORMAL HIGH (ref 1500–7800)
Neutrophils Relative %: 27.5 %
Platelets: 222 10*3/uL (ref 140–400)
RBC: 4.39 10*6/uL (ref 3.80–5.10)
RDW: 14.6 % (ref 11.0–15.0)
Total Lymphocyte: 64.6 %
WBC: 30 10*3/uL — ABNORMAL HIGH (ref 3.8–10.8)

## 2020-03-21 MED ORDER — TRAMADOL HCL 50 MG PO TABS: 100.0000 mg | ORAL_TABLET | Freq: Two times a day (BID) | ORAL | 0 refills | Status: DC

## 2020-03-21 MED ORDER — DONEPEZIL HCL 5 MG PO TABS: 5.0000 mg | ORAL_TABLET | Freq: Every day | ORAL | 5 refills | Status: AC

## 2020-03-21 MED ORDER — SHINGRIX 50 MCG/0.5ML IM SUSR: INTRAMUSCULAR | 1 refills | Status: AC

## 2020-03-21 NOTE — Progress Notes (Signed)
Subjective:    Patient ID: Joan Hill, female    DOB: October 05, 1927, 87 y.o.   MRN: [id number]  HPI  Patietn is a 86 yr old female who presents today for follow up.  Dementia-patient's daughter, who accompanies her today to her appointment, notes progressive issues with memory loss.  She reports the patient will often forget how to use simple every day objects.  The patient has lived with her daughter for the last 6 years.  Daughter states that she believes mom was having memory issues as far back as age 54.   Chronic back pain- We tried to decrease her tramadol dosing last visit, but she had increased pain, so we resumed previous dosing.  She is currently taking tramadol 100 mg p.o. twice daily.  CLL-she last saw her oncologist/hematologist Dr. Baird Cancer, back in December 2020.   Review of Systems    see HPI  Past Medical History:  Diagnosis Date  . Breast cancer ([place])   . Chronic low back pain   . CLL (chronic lymphocytic leukemia) ([place])   . Dehydration   . Hypertension   . Memory loss   . Sacral fracture ([place]) 03/2019     Social History   Socioeconomic History  . Marital status: Widowed    Spouse name: Not on file  . Number of children: Not on file  . Years of education: Not on file  . Highest education level: Not on file  Occupational History  . Not on file  Tobacco Use  . Smoking status: Never Smoker  . Smokeless tobacco: Never Used  Vaping Use  . Vaping Use: Never used  Substance and Sexual Activity  . Alcohol use: Not Currently  . Drug use: Never  . Sexual activity: Not Currently  Other Topics Concern  . Not on file  Social History Narrative   Lives with daughter and son in law   Has 2 daughters and 2 sons, oldest son is deceased, other children live locally.    Social Determinants of Health   Financial Resource Strain:   . Difficulty of Paying Living Expenses: Not on file  Food Insecurity:   . Worried About Charity fundraiser in the Last Year: Not  on file  . Ran Out of Food in the Last Year: Not on file  Transportation Needs:   . Lack of Transportation (Medical): Not on file  . Lack of Transportation (Non-Medical): Not on file  Physical Activity:   . Days of Exercise per Week: Not on file  . Minutes of Exercise per Session: Not on file  Stress:   . Feeling of Stress : Not on file  Social Connections:   . Frequency of Communication with Friends and Family: Not on file  . Frequency of Social Gatherings with Friends and Family: Not on file  . Attends Religious Services: Not on file  . Active Member of Clubs or Organizations: Not on file  . Attends Archivist Meetings: Not on file  . Marital Status: Not on file  Intimate Partner Violence:   . Fear of Current or Ex-Partner: Not on file  . Emotionally Abused: Not on file  . Physically Abused: Not on file  . Sexually Abused: Not on file    Past Surgical History:  Procedure Laterality Date  . MASTECTOMY      No family history on file.  No Known Allergies  Current Outpatient Medications on File Prior to Visit  Medication Sig Dispense Refill  . traMADol (  ULTRAM) 50 MG tablet TAKE 2 TABLETS (100 MG TOTAL) BY MOUTH 2 (TWO) TIMES DAILY. 120 tablet 0   No current facility-administered medications on file prior to visit.    BP 128/62 (BP Location: Left Arm, Patient Position: Sitting, Cuff Size: Small)   Pulse (!) 101   Temp 98.1 F (36.7 C) (Oral)   Resp 16   Ht 5\' 4"  (1.626 m)   Wt 109 lb (49.4 kg)   SpO2 94%   BMI 18.71 kg/m    Objective:   Physical Exam Constitutional:      Appearance: She is well-developed.  Cardiovascular:     Rate and Rhythm: Normal rate and regular rhythm.     Heart sounds: Normal heart sounds. No murmur heard.   Pulmonary:     Effort: Pulmonary effort is normal. No respiratory distress.     Breath sounds: Normal breath sounds. No wheezing.  Psychiatric:        Attention and Perception: She is inattentive.        Thought  Content: Thought content normal.        Cognition and Memory: Cognition is not impaired. Memory is not impaired.        Judgment: Judgment normal.           Assessment & Plan:  Dementia-daughter feels like her symptoms are progressive.  We did discuss the possibility of adding Aricept once daily to help slow further progression of her memory loss.  We discussed common side effects.  Daughter is interested in trying.  Rx was sent for Aricept 5 mg once daily.  Chronic low back pain-stable on current regimen of tramadol 100 mg p.o. twice daily.  We will continue current dosing.  A urine drug screen will be obtained today.  I did not have her sign a controlled substance contract as I do not believe she is mentally competent to sign it.  CLL-will obtain CBC, daughter is advised to schedule follow-up with Dr. Baird Cancer.  This visit occurred during the SARS-CoV-2 public health emergency.  Safety protocols were in place, including screening questions prior to the visit, additional usage of staff PPE, and extensive cleaning of exam room while observing appropriate contact time as indicated for disinfecting solutions.

## 2020-03-21 NOTE — Patient Instructions (Addendum)
Please go to [facility] to get your Shing[facility] vaccine. You are eligible for your Pfizer booster shot after 12/10. Begin aricept for memory.  Schedule a follow up appointment with Dr. Baird Cancer.

## 2020-03-22 LAB — DRUG MONITORING, PANEL 8 WITH CONFIRMATION, URINE
6 Acetylmorphine: NEGATIVE ng/mL (ref <10)
Alcohol Metabolites: NEGATIVE ng/mL
Amphetamines: NEGATIVE ng/mL (ref <500)
Benzodiazepines: NEGATIVE ng/mL (ref <100)
Buprenorphine, Urine: NEGATIVE ng/mL (ref <5)
Cocaine Metabolite: NEGATIVE ng/mL (ref <150)
Creatinine: 131.1 mg/dL
MDMA: NEGATIVE ng/mL (ref <500)
Marijuana Metabolite: NEGATIVE ng/mL (ref <20)
Opiates: NEGATIVE ng/mL (ref <100)
Oxidant: NEGATIVE ug/mL
Oxycodone: NEGATIVE ng/mL (ref <100)
pH: 6.5 (ref 4.5–9.0)

## 2020-03-22 LAB — DM TEMPLATE

## 2020-04-27 ENCOUNTER — Other Ambulatory Visit: Payer: Self-pay | Admitting: Family

## 2020-04-28 NOTE — Telephone Encounter (Signed)
Tramadol refill request. It's no longer on her med list. Please advise.

## 2020-05-29 ENCOUNTER — Encounter (HOSPITAL_BASED_OUTPATIENT_CLINIC_OR_DEPARTMENT_OTHER): Payer: Self-pay | Admitting: *Deleted

## 2020-05-29 ENCOUNTER — Emergency Department (HOSPITAL_BASED_OUTPATIENT_CLINIC_OR_DEPARTMENT_OTHER): Payer: Medicare Other

## 2020-05-29 ENCOUNTER — Other Ambulatory Visit: Payer: Self-pay

## 2020-05-29 ENCOUNTER — Inpatient Hospital Stay (HOSPITAL_BASED_OUTPATIENT_CLINIC_OR_DEPARTMENT_OTHER)
Admission: EM | Admit: 2020-05-29 | Discharge: 2020-06-02 | DRG: 178 | Disposition: A | Discharge status: Home Health | Payer: Medicare Other | Attending: Internal Medicine | Admitting: Internal Medicine

## 2020-05-29 DIAGNOSIS — G8929 Other chronic pain: Secondary | ICD-10-CM | POA: Diagnosis present

## 2020-05-29 DIAGNOSIS — C9110 Chronic lymphocytic leukemia of B-cell type not having achieved remission: Secondary | ICD-10-CM | POA: Diagnosis present

## 2020-05-29 DIAGNOSIS — D649 Anemia, unspecified: Secondary | ICD-10-CM | POA: Diagnosis present

## 2020-05-29 DIAGNOSIS — J9811 Atelectasis: Secondary | ICD-10-CM | POA: Diagnosis present

## 2020-05-29 DIAGNOSIS — R059 Cough, unspecified: Secondary | ICD-10-CM

## 2020-05-29 DIAGNOSIS — Z853 Personal history of malignant neoplasm of breast: Secondary | ICD-10-CM

## 2020-05-29 DIAGNOSIS — F0391 Unspecified dementia with behavioral disturbance: Secondary | ICD-10-CM | POA: Diagnosis present

## 2020-05-29 DIAGNOSIS — R7989 Other specified abnormal findings of blood chemistry: Secondary | ICD-10-CM | POA: Diagnosis present

## 2020-05-29 DIAGNOSIS — Z8572 Personal history of non-Hodgkin lymphomas: Secondary | ICD-10-CM

## 2020-05-29 DIAGNOSIS — J690 Pneumonitis due to inhalation of food and vomit: Secondary | ICD-10-CM | POA: Diagnosis present

## 2020-05-29 DIAGNOSIS — Z880 Allergy status to penicillin: Secondary | ICD-10-CM

## 2020-05-29 DIAGNOSIS — R9431 Abnormal electrocardiogram [ECG] [EKG]: Secondary | ICD-10-CM | POA: Diagnosis present

## 2020-05-29 DIAGNOSIS — E872 Acidosis: Secondary | ICD-10-CM | POA: Diagnosis present

## 2020-05-29 DIAGNOSIS — Z66 Do not resuscitate: Secondary | ICD-10-CM | POA: Diagnosis present

## 2020-05-29 DIAGNOSIS — E860 Dehydration: Secondary | ICD-10-CM | POA: Diagnosis present

## 2020-05-29 DIAGNOSIS — J189 Pneumonia, unspecified organism: Secondary | ICD-10-CM

## 2020-05-29 DIAGNOSIS — R0600 Dyspnea, unspecified: Secondary | ICD-10-CM

## 2020-05-29 DIAGNOSIS — Z20822 Contact with and (suspected) exposure to covid-19: Secondary | ICD-10-CM | POA: Diagnosis present

## 2020-05-29 DIAGNOSIS — R739 Hyperglycemia, unspecified: Secondary | ICD-10-CM | POA: Diagnosis present

## 2020-05-29 DIAGNOSIS — J90 Pleural effusion, not elsewhere classified: Secondary | ICD-10-CM | POA: Diagnosis present

## 2020-05-29 DIAGNOSIS — J470 Bronchiectasis with acute lower respiratory infection: Secondary | ICD-10-CM | POA: Diagnosis present

## 2020-05-29 DIAGNOSIS — Z9010 Acquired absence of unspecified breast and nipple: Secondary | ICD-10-CM

## 2020-05-29 DIAGNOSIS — Z79899 Other long term (current) drug therapy: Secondary | ICD-10-CM

## 2020-05-29 DIAGNOSIS — I10 Essential (primary) hypertension: Secondary | ICD-10-CM | POA: Diagnosis present

## 2020-05-29 DIAGNOSIS — D75839 Thrombocytosis, unspecified: Secondary | ICD-10-CM | POA: Diagnosis present

## 2020-05-29 DIAGNOSIS — R1311 Dysphagia, oral phase: Secondary | ICD-10-CM | POA: Diagnosis present

## 2020-05-29 DIAGNOSIS — E871 Hypo-osmolality and hyponatremia: Secondary | ICD-10-CM | POA: Diagnosis present

## 2020-05-29 LAB — DIFFERENTIAL
Abs Immature Granulocytes: 0.2 10*3/uL — ABNORMAL HIGH (ref 0.00–0.07)
Basophils Absolute: 0.1 10*3/uL (ref 0.0–0.1)
Basophils Relative: 0 %
Eosinophils Absolute: 0.3 10*3/uL (ref 0.0–0.5)
Eosinophils Relative: 1 %
Immature Granulocytes: 1 %
Lymphocytes Relative: 32 %
Lymphs Abs: 7.5 10*3/uL — ABNORMAL HIGH (ref 0.7–4.0)
Monocytes Absolute: 1.5 10*3/uL — ABNORMAL HIGH (ref 0.1–1.0)
Monocytes Relative: 6 %
Neutro Abs: 13.8 10*3/uL — ABNORMAL HIGH (ref 1.7–7.7)
Neutrophils Relative %: 60 %

## 2020-05-29 LAB — COMPREHENSIVE METABOLIC PANEL
ALT: 46 U/L — ABNORMAL HIGH (ref 0–44)
AST: 82 U/L — ABNORMAL HIGH (ref 15–41)
Albumin: 3 g/dL — ABNORMAL LOW (ref 3.5–5.0)
Alkaline Phosphatase: 109 U/L (ref 38–126)
Anion gap: 11 (ref 5–15)
BUN: 17 mg/dL (ref 8–23)
CO2: 25 mmol/L (ref 22–32)
Calcium: 8.7 mg/dL — ABNORMAL LOW (ref 8.9–10.3)
Chloride: 97 mmol/L — ABNORMAL LOW (ref 98–111)
Creatinine, Ser: 0.86 mg/dL (ref 0.44–1.00)
GFR, Estimated: >60 mL/min (ref >60)
Glucose, Bld: 120 mg/dL — ABNORMAL HIGH (ref 70–99)
Potassium: 3.6 mmol/L (ref 3.5–5.1)
Sodium: 133 mmol/L — ABNORMAL LOW (ref 135–145)
Total Bilirubin: 0.2 mg/dL — ABNORMAL LOW (ref 0.3–1.2)
Total Protein: 7.6 g/dL (ref 6.5–8.1)

## 2020-05-29 LAB — CBC
HCT: 36.6 % (ref 36.0–46.0)
Hemoglobin: 11.8 g/dL — ABNORMAL LOW (ref 12.0–15.0)
MCH: 28.1 pg (ref 26.0–34.0)
MCHC: 32.2 g/dL (ref 30.0–36.0)
MCV: 87.1 fL (ref 80.0–100.0)
Platelets: 367 10*3/uL (ref 150–400)
RBC: 4.2 MIL/uL (ref 3.87–5.11)
RDW: 16 % — ABNORMAL HIGH (ref 11.5–15.5)
WBC: 23.2 10*3/uL — ABNORMAL HIGH (ref 4.0–10.5)
nRBC: 0 % (ref 0.0–0.2)

## 2020-05-29 LAB — RESP PANEL BY RT-PCR (FLU A&B, COVID) ARPGX2
Influenza A by PCR: NEGATIVE
Influenza B by PCR: NEGATIVE
SARS Coronavirus 2 by RT PCR: NEGATIVE

## 2020-05-29 LAB — PROTIME-INR
INR: 1 (ref 0.8–1.2)
Prothrombin Time: 13.1 seconds (ref 11.4–15.2)

## 2020-05-29 LAB — APTT: aPTT: 38 seconds — ABNORMAL HIGH (ref 24–36)

## 2020-05-29 MED ORDER — SODIUM CHLORIDE 0.9 % IV SOLN
500.0000 mg | Freq: Once | INTRAVENOUS | Status: AC
  Administered 2020-05-29: 23:00:00 500 mg via INTRAVENOUS
  Filled 2020-05-29: qty 500

## 2020-05-29 MED ORDER — SODIUM CHLORIDE 0.9 % IV SOLN
1.0000 g | Freq: Once | INTRAVENOUS | Status: AC
  Administered 2020-05-29: 23:00:00 1 g via INTRAVENOUS
  Filled 2020-05-29: qty 10

## 2020-05-29 NOTE — ED Provider Notes (Signed)
[facility] EMERGENCY DEPARTMENT Provider Note  CSN: [id number] Arrival date & time: 05/29/20 2043    History Chief Complaint  Patient presents with  . Cough    HPI  Joan Hill is a 86 y.o. female with history of dementia brought to the ED from home by daughter who reports she has had coarse cough at home for the last several days. She has noticed that the patient has also had some trouble eating and that her food sometimes falls out of her mouth. She has not noticed a facial droop or any focal weakness, but the patient is not particularly mobile at home either. No known fevers. Level 5 caveat due to dementia.    Past Medical History:  Diagnosis Date  . Breast cancer ([place])   . Chronic low back pain   . CLL (chronic lymphocytic leukemia) ([place])   . Dehydration   . Hypertension   . Memory loss   . Sacral fracture ([place]) 03/2019    Past Surgical History:  Procedure Laterality Date  . MASTECTOMY      No family history on file.  Social History   Tobacco Use  . Smoking status: Never Smoker  . Smokeless tobacco: Never Used  Vaping Use  . Vaping Use: Never used  Substance Use Topics  . Alcohol use: Not Currently  . Drug use: Never     Home Medications Prior to Admission medications   Medication Sig Start Date End Date Taking? Authorizing Provider  donepezil (ARICEPT) 5 MG tablet Take 1 tablet (5 mg total) by mouth at bedtime. 03/21/20  Yes Debbrah Alar, NP  Zoster Vaccine Adjuvanted [facility]) injection Inject 0.5mg  IM now and again in 2-6 months. 03/21/20   Debbrah Alar, NP     Allergies    Patient has no known allergies.   Review of Systems   Review of Systems Unable to assess due to mental status.    Physical Exam BP (!) 152/74   Pulse 92   Temp 98 F (36.7 C) (Oral)   Resp 15   Ht 5\' 4"  (1.626 m)   Wt 49.4 kg   SpO2 92%   BMI 18.69 kg/m   Physical Exam Vitals and nursing note reviewed.  Constitutional:       Appearance: Normal appearance.  HENT:     Head: Normocephalic and atraumatic.     Nose: Nose normal.     Mouth/Throat:     Mouth: Mucous membranes are moist.  Eyes:     Extraocular Movements: Extraocular movements intact.     Conjunctiva/sclera: Conjunctivae normal.  Cardiovascular:     Rate and Rhythm: Normal rate.  Pulmonary:     Effort: Pulmonary effort is normal.     Breath sounds: Rhonchi present.  Abdominal:     General: Abdomen is flat.     Palpations: Abdomen is soft.     Tenderness: There is no abdominal tenderness.  Musculoskeletal:        General: No swelling. Normal range of motion.     Cervical back: Neck supple.  Skin:    General: Skin is warm and dry.  Neurological:     Mental Status: She is alert. She is disoriented.     Comments: Possible mild facial asymmetry, but unclear if this is normal for her or not, she has some weakness with grip of the L hand.   Psychiatric:        Mood and Affect: Mood normal.      ED  Results / Procedures / Treatments   Labs (all labs ordered are listed, but only abnormal results are displayed) Labs Reviewed  APTT - Abnormal; Notable for the following components:      Result Value   aPTT 38 (*)    All other components within normal limits  CBC - Abnormal; Notable for the following components:   WBC 23.2 (*)    Hemoglobin 11.8 (*)    RDW 16.0 (*)    All other components within normal limits  DIFFERENTIAL - Abnormal; Notable for the following components:   Neutro Abs 13.8 (*)    Lymphs Abs 7.5 (*)    Monocytes Absolute 1.5 (*)    Abs Immature Granulocytes 0.20 (*)    All other components within normal limits  COMPREHENSIVE METABOLIC PANEL - Abnormal; Notable for the following components:   Sodium 133 (*)    Chloride 97 (*)    Glucose, Bld 120 (*)    Calcium 8.7 (*)    Albumin 3.0 (*)    AST 82 (*)    ALT 46 (*)    Total Bilirubin 0.2 (*)    All other components within normal limits  RESP PANEL BY RT-PCR (FLU A&B,  COVID) ARPGX2  CULTURE, BLOOD (ROUTINE X 2)  CULTURE, BLOOD (ROUTINE X 2)  PROTIME-INR  URINALYSIS, ROUTINE W REFLEX MICROSCOPIC  PATHOLOGIST SMEAR REVIEW    EKG EKG Interpretation  Date/Time:  Monday May 29 2020 21:15:54 EST Ventricular Rate:  94 PR Interval:    QRS Duration: 145 QT Interval:  390 QTC Calculation: 488 R Axis:   1 Text Interpretation: Sinus rhythm Right bundle branch block Borderline ST depression, lateral leads No old tracing to compare Confirmed by Calvert Cantor [phone]) on 05/29/2020 9:20:36 PM    Radiology CT Head Wo Contrast  Result Date: 05/29/2020 CLINICAL DATA:  Acute neurologic deficit, stroke, hypertension, breast cancer EXAM: CT HEAD WITHOUT CONTRAST TECHNIQUE: Contiguous axial images were obtained from the base of the skull through the vertex without intravenous contrast. COMPARISON:  MRI 06/18/2017 FINDINGS: Brain: Normal anatomic configuration. Moderate parenchymal volume loss is commensurate with the patient's age. The degree of parenchymal volume loss has progressed since prior examination, however. Mild periventricular white matter changes are present likely reflecting the sequela of small vessel ischemia. Tiny remote infarct noted within the right cerebellar hemisphere no abnormal intra or extra-axial mass lesion or fluid collection. No abnormal mass effect or midline shift. No evidence of acute intracranial hemorrhage or infarct. The ventricles are mildly dilated, stable since prior examination and commensurate with the degree of parenchymal volume loss. Cerebellum unremarkable. Vascular: No asymmetric hyperdense vasculature at the skull base. Skull: Intact Sinuses/Orbits: There is dense opacification of the left maxillary sinus as well as multiple left ethmoid air cells. Mild mucosal thickening is seen within the frontal sinuses, left greater than right. The orbits are unremarkable. Other: There is fluid opacification of the left mastoid air cells  without associated osseous erosion, similar to that noted on prior examination. Right mastoid air cells are clear. Middle ear cavities are clear. IMPRESSION: No evidence of acute intracranial hemorrhage or infarct. Advanced senescent changes with progressive parenchymal atrophy since prior examination. Mild ventriculomegaly is stable and likely reflects the sequela of central atrophy. Stable left mastoid effusion. Extensive predominantly left-sided paranasal sinus disease with near complete opacification of the left maxillary sinus. Electronically Signed   By: Fidela Salisbury MD   On: 05/29/2020 21:59   DG Chest Port 1 View  Result Date:  05/29/2020 CLINICAL DATA:  Cough, chest congestion EXAM: PORTABLE CHEST 1 VIEW COMPARISON:  01/05/2014 FINDINGS: There is focal consolidation at the right lung base, possibly infectious in the appropriate clinical setting. Small bilateral pleural effusions are present, left greater than right. Cardiac size is within normal limits. Pulmonary vascularity is normal. No pneumothorax. No acute bone abnormality. Surgical clips are seen within the left axilla. IMPRESSION: Right basilar consolidation possibly reflecting changes of acute lobar pneumonia in the appropriate clinical setting. Small bilateral pleural effusions. Electronically Signed   By: Fidela Salisbury MD   On: 05/29/2020 21:52    Procedures Procedures  Medications Ordered in the ED Medications  azithromycin (ZITHROMAX) 500 mg in sodium chloride 0.9 % 250 mL IVPB (500 mg Intravenous New Bag/Given 05/29/20 2240)  cefTRIAXone (ROCEPHIN) 1 g in sodium chloride 0.9 % 100 mL IVPB (0 g Intravenous Stopped 05/29/20 2313)     MDM Rules/Calculators/A&P MDM Patient with coarse cough, possible new facial droop and concerns for dysphagia given history of food falling out of her mouth. Will check labs, CT for new stroke and CXR for signs of aspiration.   ED Course  I have reviewed the triage vital signs and the nursing  notes.  Pertinent labs & imaging results that were available during my care of the patient were reviewed by me and considered in my medical decision making (see chart for details).  Clinical Course as of 05/29/20 2332  Mon May 29, 2020  2138 CBC shows leukocytosis, similar to previous and consistent with known history of CLL.  [CS]  2139 Coags are unremarkable.  [CS]  2145 CMP unremarkable. Mild elevation of LFTs of unclear significance.  [CS]  2212 CXR concerning for pneumonia, possible aspiration. Will add blood cultures and begin CAP treatment.  [CS]  2323 Spoke with Dr. Marlowe Sax, Hospitalist, who will admit the patient for further evaluation.  [CS]    Clinical Course User Index [CS] Truddie Hidden, MD    Final Clinical Impression(s) / ED Diagnoses Final diagnoses:  Community acquired pneumonia of right lower lobe of lung    Rx / DC Orders ED Discharge Orders    None       Truddie Hidden, MD 05/29/20 2332

## 2020-05-29 NOTE — Progress Notes (Signed)
Patient's SPO2 dropped to 88%.  Placed patient on 2 liter nasal cannula.  Patient's SPO2 increased to 94%.  RT will continue to monitor.

## 2020-05-29 NOTE — ED Triage Notes (Signed)
Cough and congestion x5 days.

## 2020-05-29 NOTE — ED Notes (Signed)
Patient transported to CT 

## 2020-05-30 ENCOUNTER — Telehealth: Payer: Medicare Other | Admitting: Family

## 2020-05-30 DIAGNOSIS — R7989 Other specified abnormal findings of blood chemistry: Secondary | ICD-10-CM | POA: Diagnosis present

## 2020-05-30 DIAGNOSIS — J470 Bronchiectasis with acute lower respiratory infection: Secondary | ICD-10-CM | POA: Diagnosis present

## 2020-05-30 DIAGNOSIS — J90 Pleural effusion, not elsewhere classified: Secondary | ICD-10-CM | POA: Diagnosis present

## 2020-05-30 DIAGNOSIS — R059 Cough, unspecified: Secondary | ICD-10-CM | POA: Diagnosis present

## 2020-05-30 DIAGNOSIS — R9431 Abnormal electrocardiogram [ECG] [EKG]: Secondary | ICD-10-CM | POA: Diagnosis present

## 2020-05-30 DIAGNOSIS — J9811 Atelectasis: Secondary | ICD-10-CM | POA: Diagnosis present

## 2020-05-30 DIAGNOSIS — D649 Anemia, unspecified: Secondary | ICD-10-CM | POA: Diagnosis present

## 2020-05-30 DIAGNOSIS — I10 Essential (primary) hypertension: Secondary | ICD-10-CM | POA: Diagnosis present

## 2020-05-30 DIAGNOSIS — Z8572 Personal history of non-Hodgkin lymphomas: Secondary | ICD-10-CM | POA: Diagnosis not present

## 2020-05-30 DIAGNOSIS — F0391 Unspecified dementia with behavioral disturbance: Secondary | ICD-10-CM | POA: Diagnosis present

## 2020-05-30 DIAGNOSIS — D75839 Thrombocytosis, unspecified: Secondary | ICD-10-CM | POA: Diagnosis present

## 2020-05-30 DIAGNOSIS — E871 Hypo-osmolality and hyponatremia: Secondary | ICD-10-CM | POA: Diagnosis present

## 2020-05-30 DIAGNOSIS — Z853 Personal history of malignant neoplasm of breast: Secondary | ICD-10-CM | POA: Diagnosis not present

## 2020-05-30 DIAGNOSIS — R1311 Dysphagia, oral phase: Secondary | ICD-10-CM | POA: Diagnosis present

## 2020-05-30 DIAGNOSIS — R7881 Bacteremia: Secondary | ICD-10-CM | POA: Diagnosis not present

## 2020-05-30 DIAGNOSIS — Z9010 Acquired absence of unspecified breast and nipple: Secondary | ICD-10-CM | POA: Diagnosis not present

## 2020-05-30 DIAGNOSIS — J690 Pneumonitis due to inhalation of food and vomit: Secondary | ICD-10-CM | POA: Diagnosis present

## 2020-05-30 DIAGNOSIS — E860 Dehydration: Secondary | ICD-10-CM | POA: Diagnosis present

## 2020-05-30 DIAGNOSIS — E872 Acidosis: Secondary | ICD-10-CM | POA: Diagnosis present

## 2020-05-30 DIAGNOSIS — Z20822 Contact with and (suspected) exposure to covid-19: Secondary | ICD-10-CM | POA: Diagnosis present

## 2020-05-30 DIAGNOSIS — Z66 Do not resuscitate: Secondary | ICD-10-CM | POA: Diagnosis present

## 2020-05-30 DIAGNOSIS — Z880 Allergy status to penicillin: Secondary | ICD-10-CM | POA: Diagnosis not present

## 2020-05-30 DIAGNOSIS — Z79899 Other long term (current) drug therapy: Secondary | ICD-10-CM | POA: Diagnosis not present

## 2020-05-30 DIAGNOSIS — R739 Hyperglycemia, unspecified: Secondary | ICD-10-CM | POA: Diagnosis present

## 2020-05-30 DIAGNOSIS — F0390 Unspecified dementia without behavioral disturbance: Secondary | ICD-10-CM | POA: Diagnosis not present

## 2020-05-30 DIAGNOSIS — G8929 Other chronic pain: Secondary | ICD-10-CM | POA: Diagnosis present

## 2020-05-30 DIAGNOSIS — C9110 Chronic lymphocytic leukemia of B-cell type not having achieved remission: Secondary | ICD-10-CM | POA: Diagnosis present

## 2020-05-30 LAB — URINALYSIS, ROUTINE W REFLEX MICROSCOPIC
Bilirubin Urine: NEGATIVE
Glucose, UA: NEGATIVE mg/dL
Ketones, ur: NEGATIVE mg/dL
Nitrite: NEGATIVE
Protein, ur: NEGATIVE mg/dL
Specific Gravity, Urine: 1.02 (ref 1.005–1.030)
pH: 6.5 (ref 5.0–8.0)

## 2020-05-30 LAB — CBC
HCT: 35.2 % — ABNORMAL LOW (ref 36.0–46.0)
Hemoglobin: 11.3 g/dL — ABNORMAL LOW (ref 12.0–15.0)
MCH: 27.4 pg (ref 26.0–34.0)
MCHC: 32.1 g/dL (ref 30.0–36.0)
MCV: 85.2 fL (ref 80.0–100.0)
Platelets: 366 K/uL (ref 150–400)
RBC: 4.13 MIL/uL (ref 3.87–5.11)
RDW: 15.9 % — ABNORMAL HIGH (ref 11.5–15.5)
WBC: 22.5 K/uL — ABNORMAL HIGH (ref 4.0–10.5)
nRBC: 0 % (ref 0.0–0.2)

## 2020-05-30 LAB — URINALYSIS, MICROSCOPIC (REFLEX)

## 2020-05-30 LAB — CREATININE, SERUM
Creatinine, Ser: 0.73 mg/dL (ref 0.44–1.00)
GFR, Estimated: >60 mL/min (ref >60)

## 2020-05-30 MED ORDER — POLYETHYLENE GLYCOL 3350 17 G PO PACK: 17.0000 g | PACK | Freq: Every day | ORAL | Status: DC | PRN

## 2020-05-30 MED ORDER — DONEPEZIL HCL 10 MG PO TABS
5.0000 mg | ORAL_TABLET | Freq: Every day | ORAL | Status: DC
  Administered 2020-05-31 – 2020-06-01 (×2): 5 mg via ORAL
  Filled 2020-05-30 (×2): qty 1

## 2020-05-30 MED ORDER — ONDANSETRON HCL 4 MG PO TABS: 4.0000 mg | ORAL_TABLET | Freq: Four times a day (QID) | ORAL | Status: DC | PRN

## 2020-05-30 MED ORDER — SODIUM CHLORIDE 0.9 % IV SOLN: INTRAVENOUS | Status: DC

## 2020-05-30 MED ORDER — SODIUM CHLORIDE 0.9 % IV SOLN
3.0000 g | Freq: Three times a day (TID) | INTRAVENOUS | Status: DC
  Administered 2020-05-30 – 2020-06-01 (×6): 3 g via INTRAVENOUS
  Filled 2020-05-30: qty 8
  Filled 2020-05-30 (×3): qty 3
  Filled 2020-05-30 (×2): qty 8
  Filled 2020-05-30 (×2): qty 3

## 2020-05-30 MED ORDER — SODIUM CHLORIDE 0.9 % IV SOLN
1.0000 g | INTRAVENOUS | Status: DC
  Filled 2020-05-30: qty 10

## 2020-05-30 MED ORDER — TRAMADOL HCL 50 MG PO TABS
50.0000 mg | ORAL_TABLET | Freq: Two times a day (BID) | ORAL | Status: DC | PRN
  Administered 2020-06-02: 50 mg via ORAL
  Filled 2020-05-30: qty 1

## 2020-05-30 MED ORDER — ENOXAPARIN SODIUM 30 MG/0.3ML ~~LOC~~ SOLN
30.0000 mg | SUBCUTANEOUS | Status: DC
  Administered 2020-05-30: 30 mg via SUBCUTANEOUS
  Filled 2020-05-30: qty 0.3

## 2020-05-30 MED ORDER — SODIUM CHLORIDE 0.9 % IV SOLN
3.0000 g | Freq: Three times a day (TID) | INTRAVENOUS | Status: DC
  Administered 2020-05-30: 3 g via INTRAVENOUS
  Filled 2020-05-30: qty 3
  Filled 2020-05-30: qty 8

## 2020-05-30 MED ORDER — LORAZEPAM 2 MG/ML IJ SOLN
0.5000 mg | Freq: Once | INTRAMUSCULAR | Status: AC | PRN
  Administered 2020-05-30: 0.5 mg via INTRAVENOUS
  Filled 2020-05-30: qty 1

## 2020-05-30 MED ORDER — ACETAMINOPHEN 650 MG RE SUPP: 650.0000 mg | Freq: Four times a day (QID) | RECTAL | Status: DC | PRN

## 2020-05-30 MED ORDER — ONDANSETRON HCL 4 MG/2ML IJ SOLN
4.0000 mg | Freq: Four times a day (QID) | INTRAMUSCULAR | Status: DC | PRN
  Administered 2020-05-31: 4 mg via INTRAVENOUS
  Filled 2020-05-30: qty 2

## 2020-05-30 MED ORDER — ACETAMINOPHEN 325 MG PO TABS: 650.0000 mg | ORAL_TABLET | Freq: Four times a day (QID) | ORAL | Status: DC | PRN

## 2020-05-30 MED ORDER — SODIUM CHLORIDE 0.9 % IV SOLN
100.0000 mg | Freq: Two times a day (BID) | INTRAVENOUS | Status: DC
  Filled 2020-05-30: qty 100

## 2020-05-30 NOTE — Progress Notes (Signed)
Tele called and said all leads were off pt. Went to assess, pt. Took off all leads and tele box and gown. Again, tried to explain to pt. That we need to keep tele box on.

## 2020-05-30 NOTE — Progress Notes (Signed)
Tele called and said that all leads are off again. Went to assess, pt. Took off tele box, purewick, gown and removed one of her IV's, hand mittens applied to hands.

## 2020-05-30 NOTE — ED Notes (Signed)
Pt daughter at bedside, pt daughter states she is taking pt glasses home.

## 2020-05-30 NOTE — Progress Notes (Addendum)
Pharmacy Antibiotic Note  Joan Hill is a 86 y.o. female admitted on 05/29/2020 with aspiration PNA.  Pharmacy has been consulted for Unasyn dosing. SCr 0.86.  ADDENDUM - Allergies updated later in the evening to with amoxicillin "unspecified" allergy. Patient tolerated ceftriaxone dose in the ER with no reported issues. Dr. Marlowe Sax initially elected to change antibiotic regimen but now would like to continue Unasyn, as admitting physician's note clarified that daughter was not aware of patient having any significant allergies.  Plan: Unasyn 3g IV q8h Monitor clinical progress, c/s, renal function F/u LOT   Height: 5\' 4"  (162.6 cm) Weight: 49.4 kg (108 lb 14.5 oz) IBW/kg (Calculated) : 54.7  Temp (24hrs), Avg:98.1 F (36.7 C), Min:97.9 F (36.6 C), Max:98.4 F (36.9 C)  Recent Labs  Lab 05/29/20 2110  WBC 23.2*  CREATININE 0.86    Estimated Creatinine Clearance: 33.9 mL/min (by C-G formula based on SCr of 0.86 mg/dL).    No Known Allergies   Arturo Morton, PharmD, BCPS Please check AMION for all [facility] contact numbers Clinical Pharmacist 05/30/2020 5:31 PM

## 2020-05-30 NOTE — ED Notes (Signed)
Pt standing in room, pt has removed all EKG leads/ SPO2 monitor/[place]/BP cuffs pt redirected to bed, pt denies pain at this time.

## 2020-05-30 NOTE — Progress Notes (Addendum)
Tele called saying all leads are off of pt., went to assess, pt. Took all leads/tele box off, pt. Took purewick off, gown off. Reapplied to pt. And explained it is necessary for her care.

## 2020-05-30 NOTE — Progress Notes (Signed)
Pt arrived from [facility] via [place]. Pt was stable on arrival. All possessions and call bell are within reach of patient. Admitting MD paged.   Joan Hill

## 2020-05-30 NOTE — H&P (Signed)
History and Physical    Joan Hill DUK:[id number] DOB: August 07, 1927 DOA: 05/29/2020  PCP: Debbrah Alar, NP   Patient coming from: Home  I have personally briefly reviewed patient's old medical records in [place]  Chief Complaint: Cough and increased drooling.  HPI: Joan Hill is a 86 y.o. female with medical history significant of advanced dementia, hypertension, breast cancer and lymphoma survivor was brought to [facility]  with complaint of some cough and upper respiratory symptoms more prominent with eating and drinking for the past 1 week. Patient was unable to provide any history, which was obtained on phone from her daughter who is also her POA and patient lives with her.  According to her she was having some cough especially with eating and drinking and increased drooling for the past 1 week.  Denies any fever or chills.  Denies any sick contacts.  Denies any nausea and vomiting.  There was some concern of facial drop at urgent care but family never noticed any changes, CT head was done and it was negative for any acute infarct.  Per daughter patient is completely dependent for all her ADLs, recently keeping food in her mouth, occasionally spitting out. Daughter is not aware of any urinary symptoms.  Poor p.o. intake. Amoxicillin allergies were noted in her chart but daughter is not aware of any allergies. Per daughter she was on ramipril 10 mg daily which was taken off recently by her provider as her blood pressure was low.  ED Course: Hemodynamically stable, labs pertinent for leukocytosis, chest x-ray with right basilar consolidation concerning for aspiration pneumonia.  CT head was negative for any acute infarct.  Review of Systems: As per HPI otherwise 10 point review of systems negative.   Past Medical History:  Diagnosis Date  . Breast cancer ([place])   . Chronic low back pain   . CLL (chronic lymphocytic leukemia) ([place])   . Dehydration   . Hypertension   .  Memory loss   . Sacral fracture ([place]) 03/2019    Past Surgical History:  Procedure Laterality Date  . MASTECTOMY       reports that she has never smoked. She has never used smokeless tobacco. She reports previous alcohol use. She reports that she does not use drugs.  Allergies  Allergen Reactions  . Amoxicillin     Unknown    No family history on file.  Prior to Admission medications   Medication Sig Start Date End Date Taking? Authorizing Provider  donepezil (ARICEPT) 5 MG tablet Take 1 tablet (5 mg total) by mouth at bedtime. 03/21/20  Yes Debbrah Alar, NP  traMADol (ULTRAM) 50 MG tablet Take 50 mg by mouth 4 (four) times daily.   Yes [provider]  Zoster Vaccine Adjuvanted [facility]) injection Inject 0.5mg  IM now and again in 2-6 months. 03/21/20   Debbrah Alar, NP    Physical Exam: Vitals:   05/30/20 1130 05/30/20 1400 05/30/20 1530 05/30/20 1711  BP: (!) 150/76 (!) 147/60 (!) 150/63 (!) 178/78  Pulse: 95 95 99 (!) 102  Resp: 16 16 16 18   Temp:    97.9 F (36.6 C)  TempSrc:    Axillary  SpO2: 96% 97% 93% 95%  Weight:      Height:        General: Vital signs reviewed.  Patient is a pleasantly demented , very frail elderly lady, in no acute distress and cooperative with exam.  Head: Normocephalic and atraumatic. Eyes: EOMI, conjunctivae normal, no scleral  icterus.  ENMT: Mucous membranes are moist. Neck: Supple, trachea midline,  Cardiovascular: RRR, S1 normal, S2 normal, no murmurs, gallops, or rubs. Pulmonary/Chest: Clear to auscultation bilaterally, no wheezes, rales, or rhonchi. Abdominal: Soft, non-tender, non-distended, BS +, Extremities: No lower extremity edema bilaterally,  pulses symmetric and intact bilaterally. No cyanosis or clubbing. Neurological: Oriented to name only, Strength is normal and symmetric bilaterally,no focal motor deficit. Skin: Warm, dry and intact.  Psychiatric: Normal mood and affect.   Labs on Admission: I  have personally reviewed following labs and imaging studies  CBC: Recent Labs  Lab 05/29/20 2110  WBC 23.2*  NEUTROABS 13.8*  HGB 11.8*  HCT 36.6  MCV 87.1  PLT 923   Basic Metabolic Panel: Recent Labs  Lab 05/29/20 2110  NA 133*  K 3.6  CL 97*  CO2 25  GLUCOSE 120*  BUN 17  CREATININE 0.86  CALCIUM 8.7*   GFR: Estimated Creatinine Clearance: 33.9 mL/min (by C-G formula based on SCr of 0.86 mg/dL). Liver Function Tests: Recent Labs  Lab 05/29/20 2110  AST 82*  ALT 46*  ALKPHOS 109  BILITOT 0.2*  PROT 7.6  ALBUMIN 3.0*   No results for input(s): LIPASE, AMYLASE in the last 168 hours. No results for input(s): AMMONIA in the last 168 hours. Coagulation Profile: Recent Labs  Lab 05/29/20 2110  INR 1.0   Cardiac Enzymes: No results for input(s): CKTOTAL, CKMB, CKMBINDEX, TROPONINI in the last 168 hours. BNP (last 3 results) No results for input(s): PROBNP in the last 8760 hours. HbA1C: No results for input(s): HGBA1C in the last 72 hours. CBG: No results for input(s): GLUCAP in the last 168 hours. Lipid Profile: No results for input(s): CHOL, HDL, LDLCALC, TRIG, CHOLHDL, LDLDIRECT in the last 72 hours. Thyroid Function Tests: No results for input(s): TSH, T4TOTAL, FREET4, T3FREE, THYROIDAB in the last 72 hours. Anemia Panel: No results for input(s): VITAMINB12, FOLATE, FERRITIN, TIBC, IRON, RETICCTPCT in the last 72 hours. Urine analysis:    Component Value Date/Time   COLORURINE YELLOW 05/30/2020 0700   APPEARANCEUR CLEAR 05/30/2020 0700   LABSPEC 1.020 05/30/2020 0700   PHURINE 6.5 05/30/2020 0700   GLUCOSEU NEGATIVE 05/30/2020 0700   HGBUR TRACE (A) 05/30/2020 0700   BILIRUBINUR NEGATIVE 05/30/2020 0700   KETONESUR NEGATIVE 05/30/2020 0700   PROTEINUR NEGATIVE 05/30/2020 0700   NITRITE NEGATIVE 05/30/2020 0700   LEUKOCYTESUR SMALL (A) 05/30/2020 0700    Radiological Exams on Admission: CT Head Wo Contrast  Result Date: 05/29/2020 CLINICAL  DATA:  Acute neurologic deficit, stroke, hypertension, breast cancer EXAM: CT HEAD WITHOUT CONTRAST TECHNIQUE: Contiguous axial images were obtained from the base of the skull through the vertex without intravenous contrast. COMPARISON:  MRI 06/18/2017 FINDINGS: Brain: Normal anatomic configuration. Moderate parenchymal volume loss is commensurate with the patient's age. The degree of parenchymal volume loss has progressed since prior examination, however. Mild periventricular white matter changes are present likely reflecting the sequela of small vessel ischemia. Tiny remote infarct noted within the right cerebellar hemisphere no abnormal intra or extra-axial mass lesion or fluid collection. No abnormal mass effect or midline shift. No evidence of acute intracranial hemorrhage or infarct. The ventricles are mildly dilated, stable since prior examination and commensurate with the degree of parenchymal volume loss. Cerebellum unremarkable. Vascular: No asymmetric hyperdense vasculature at the skull base. Skull: Intact Sinuses/Orbits: There is dense opacification of the left maxillary sinus as well as multiple left ethmoid air cells. Mild mucosal thickening is seen within the frontal  sinuses, left greater than right. The orbits are unremarkable. Other: There is fluid opacification of the left mastoid air cells without associated osseous erosion, similar to that noted on prior examination. Right mastoid air cells are clear. Middle ear cavities are clear. IMPRESSION: No evidence of acute intracranial hemorrhage or infarct. Advanced senescent changes with progressive parenchymal atrophy since prior examination. Mild ventriculomegaly is stable and likely reflects the sequela of central atrophy. Stable left mastoid effusion. Extensive predominantly left-sided paranasal sinus disease with near complete opacification of the left maxillary sinus. Electronically Signed   By: Fidela Salisbury MD   On: 05/29/2020 21:59   DG  Chest Port 1 View  Result Date: 05/29/2020 CLINICAL DATA:  Cough, chest congestion EXAM: PORTABLE CHEST 1 VIEW COMPARISON:  01/05/2014 FINDINGS: There is focal consolidation at the right lung base, possibly infectious in the appropriate clinical setting. Small bilateral pleural effusions are present, left greater than right. Cardiac size is within normal limits. Pulmonary vascularity is normal. No pneumothorax. No acute bone abnormality. Surgical clips are seen within the left axilla. IMPRESSION: Right basilar consolidation possibly reflecting changes of acute lobar pneumonia in the appropriate clinical setting. Small bilateral pleural effusions. Electronically Signed   By: Fidela Salisbury MD   On: 05/29/2020 21:52    EKG: Independently reviewed.  Normal sinus rhythm with right bundle branch block.  Assessment/Plan Active Problems:   Aspiration pneumonia (HCC)   Aspiration pneumonia.  Patient received Zithromax and ceftriaxone in ED. Amoxicillin allergy was listed in her chart but daughter was not aware of any serious allergies.  Concern of aspiration with eating and drinking.  CT head negative for any acute infarct. -Keep her n.p.o. -Obtain swallow evaluation-and start diet according to the recommendations. -Start her on Unasyn -Normal saline at 75 mL/h, can be discontinued once started eating and drinking.  Advanced dementia.  Patient is oriented to name only.  Completely dependent for all her ADL, lives with her daughter.  Per daughter she will come back home, she is not interested in any facility. -Continue home dose of Aricept.  Chronic back pain.  -Continue home dose of tramadol as needed.  Hypertension.  Patient was on ramipril 10 mg daily at home and recently taken off by her PCP due to labile blood pressure. -Monitor without any medication at this time.   DVT prophylaxis: Lovenox Code Status: DNR discussed with her daughter. Family Communication: Discussed with daughter on  phone Disposition Plan: Back home Consults called: None Admission status: Inpatient   Lorella Nimrod MD [facility]  If 7PM-7AM, please contact night-coverage [email or web address]  05/30/2020, 6:33 PM   This record has been created using Dragon voice recognition software. Errors have been sought and corrected,but may not always be located. Such creation errors do not reflect on the standard of care.

## 2020-05-30 NOTE — Plan of Care (Signed)

## 2020-05-31 ENCOUNTER — Inpatient Hospital Stay (HOSPITAL_COMMUNITY): Payer: Medicare Other

## 2020-05-31 DIAGNOSIS — E872 Acidosis: Secondary | ICD-10-CM

## 2020-05-31 DIAGNOSIS — J690 Pneumonitis due to inhalation of food and vomit: Principal | ICD-10-CM

## 2020-05-31 DIAGNOSIS — R739 Hyperglycemia, unspecified: Secondary | ICD-10-CM

## 2020-05-31 DIAGNOSIS — F0390 Unspecified dementia without behavioral disturbance: Secondary | ICD-10-CM

## 2020-05-31 LAB — BLOOD CULTURE ID PANEL (REFLEXED) - BCID2
A.calcoaceticus-baumannii: NOT DETECTED
A.calcoaceticus-baumannii: NOT DETECTED
Bacteroides fragilis: NOT DETECTED
Bacteroides fragilis: NOT DETECTED
Candida albicans: NOT DETECTED
Candida albicans: NOT DETECTED
Candida auris: NOT DETECTED
Candida auris: NOT DETECTED
Candida glabrata: NOT DETECTED
Candida glabrata: NOT DETECTED
Candida krusei: NOT DETECTED
Candida krusei: NOT DETECTED
Candida parapsilosis: NOT DETECTED
Candida parapsilosis: NOT DETECTED
Candida tropicalis: NOT DETECTED
Candida tropicalis: NOT DETECTED
Cryptococcus neoformans/gattii: NOT DETECTED
Cryptococcus neoformans/gattii: NOT DETECTED
Enterobacter cloacae complex: NOT DETECTED
Enterobacter cloacae complex: NOT DETECTED
Enterobacterales: NOT DETECTED
Enterobacterales: NOT DETECTED
Enterococcus Faecium: NOT DETECTED
Enterococcus Faecium: NOT DETECTED
Enterococcus faecalis: NOT DETECTED
Enterococcus faecalis: NOT DETECTED
Escherichia coli: NOT DETECTED
Escherichia coli: NOT DETECTED
Haemophilus influenzae: NOT DETECTED
Haemophilus influenzae: NOT DETECTED
Klebsiella aerogenes: NOT DETECTED
Klebsiella aerogenes: NOT DETECTED
Klebsiella oxytoca: NOT DETECTED
Klebsiella oxytoca: NOT DETECTED
Klebsiella pneumoniae: NOT DETECTED
Klebsiella pneumoniae: NOT DETECTED
Listeria monocytogenes: NOT DETECTED
Listeria monocytogenes: NOT DETECTED
Neisseria meningitidis: NOT DETECTED
Neisseria meningitidis: NOT DETECTED
Proteus species: NOT DETECTED
Proteus species: NOT DETECTED
Pseudomonas aeruginosa: NOT DETECTED
Pseudomonas aeruginosa: NOT DETECTED
Salmonella species: NOT DETECTED
Salmonella species: NOT DETECTED
Serratia marcescens: NOT DETECTED
Serratia marcescens: NOT DETECTED
Staphylococcus aureus (BCID): NOT DETECTED
Staphylococcus aureus (BCID): NOT DETECTED
Staphylococcus epidermidis: NOT DETECTED
Staphylococcus epidermidis: NOT DETECTED
Staphylococcus lugdunensis: NOT DETECTED
Staphylococcus lugdunensis: NOT DETECTED
Staphylococcus species: DETECTED — AB
Staphylococcus species: NOT DETECTED
Stenotrophomonas maltophilia: NOT DETECTED
Stenotrophomonas maltophilia: NOT DETECTED
Streptococcus agalactiae: NOT DETECTED
Streptococcus agalactiae: NOT DETECTED
Streptococcus pneumoniae: NOT DETECTED
Streptococcus pneumoniae: NOT DETECTED
Streptococcus pyogenes: NOT DETECTED
Streptococcus pyogenes: NOT DETECTED
Streptococcus species: DETECTED — AB
Streptococcus species: NOT DETECTED

## 2020-05-31 LAB — CBC
HCT: 39 % (ref 36.0–46.0)
Hemoglobin: 13 g/dL (ref 12.0–15.0)
MCH: 28.3 pg (ref 26.0–34.0)
MCHC: 33.3 g/dL (ref 30.0–36.0)
MCV: 84.8 fL (ref 80.0–100.0)
Platelets: 355 10*3/uL (ref 150–400)
RBC: 4.6 MIL/uL (ref 3.87–5.11)
RDW: 15.9 % — ABNORMAL HIGH (ref 11.5–15.5)
WBC: 15.3 10*3/uL — ABNORMAL HIGH (ref 4.0–10.5)
nRBC: 0 % (ref 0.0–0.2)

## 2020-05-31 LAB — BASIC METABOLIC PANEL
Anion gap: 15 (ref 5–15)
BUN: 10 mg/dL (ref 8–23)
CO2: 19 mmol/L — ABNORMAL LOW (ref 22–32)
Calcium: 9 mg/dL (ref 8.9–10.3)
Chloride: 105 mmol/L (ref 98–111)
Creatinine, Ser: 0.76 mg/dL (ref 0.44–1.00)
GFR, Estimated: >60 mL/min (ref >60)
Glucose, Bld: 109 mg/dL — ABNORMAL HIGH (ref 70–99)
Potassium: 3.5 mmol/L (ref 3.5–5.1)
Sodium: 139 mmol/L (ref 135–145)

## 2020-05-31 MED ORDER — HYDRALAZINE HCL 20 MG/ML IJ SOLN
10.0000 mg | Freq: Four times a day (QID) | INTRAMUSCULAR | Status: DC | PRN
  Administered 2020-05-31: 10 mg via INTRAVENOUS
  Filled 2020-05-31: qty 1

## 2020-05-31 MED ORDER — SODIUM CHLORIDE 0.9 % IV SOLN: INTRAVENOUS | Status: AC

## 2020-05-31 MED ORDER — ENOXAPARIN SODIUM 40 MG/0.4ML ~~LOC~~ SOLN
40.0000 mg | SUBCUTANEOUS | Status: DC
  Administered 2020-05-31 – 2020-06-01 (×2): 40 mg via SUBCUTANEOUS
  Filled 2020-05-31 (×3): qty 0.4

## 2020-05-31 NOTE — Progress Notes (Signed)
PHARMACY - PHYSICIAN COMMUNICATION CRITICAL VALUE ALERT - BLOOD CULTURE IDENTIFICATION (BCID)  Joan Hill is an 86 y.o. female who presented to [facility] on 05/29/2020 with a chief complaint of cough and increased drooling.  Assessment:  23 YOF with increased cough and respiratory symptoms more prominent with eating and drinking over the past week and started on antibiotics for aspiration PNA. Now with 1 aerobic bottle growing a staph species (no resistance detected, presumably MSSE) and the other aerobic bottle growing a strep species. Both of these bottles could be representing contamination. The patient appears stable on her current antibiotic regimen  Name of physician (or Provider) Contacted: Sheikh  Current antibiotics: Unasyn 3g IV every 8 hours  Changes to prescribed antibiotics recommended:  None recommended - could both be contamination. MD repeating blood cutures. Continue Unasyn for now.    Results for orders placed or performed during the hospital encounter of 05/29/20  Blood Culture ID Panel (Reflexed) (Collected: 05/29/2020 10:10 PM)  Result Value Ref Range   Enterococcus faecalis NOT DETECTED NOT DETECTED   Enterococcus Faecium NOT DETECTED NOT DETECTED   Listeria monocytogenes NOT DETECTED NOT DETECTED   Staphylococcus species DETECTED (A) NOT DETECTED   Staphylococcus aureus (BCID) NOT DETECTED NOT DETECTED   Staphylococcus epidermidis NOT DETECTED NOT DETECTED   Staphylococcus lugdunensis NOT DETECTED NOT DETECTED   Streptococcus species NOT DETECTED NOT DETECTED   Streptococcus agalactiae NOT DETECTED NOT DETECTED   Streptococcus pneumoniae NOT DETECTED NOT DETECTED   Streptococcus pyogenes NOT DETECTED NOT DETECTED   A.calcoaceticus-baumannii NOT DETECTED NOT DETECTED   Bacteroides fragilis NOT DETECTED NOT DETECTED   Enterobacterales NOT DETECTED NOT DETECTED   Enterobacter cloacae complex NOT DETECTED NOT DETECTED   Escherichia coli NOT DETECTED NOT  DETECTED   Klebsiella aerogenes NOT DETECTED NOT DETECTED   Klebsiella oxytoca NOT DETECTED NOT DETECTED   Klebsiella pneumoniae NOT DETECTED NOT DETECTED   Proteus species NOT DETECTED NOT DETECTED   Salmonella species NOT DETECTED NOT DETECTED   Serratia marcescens NOT DETECTED NOT DETECTED   Haemophilus influenzae NOT DETECTED NOT DETECTED   Neisseria meningitidis NOT DETECTED NOT DETECTED   Pseudomonas aeruginosa NOT DETECTED NOT DETECTED   Stenotrophomonas maltophilia NOT DETECTED NOT DETECTED   Candida albicans NOT DETECTED NOT DETECTED   Candida auris NOT DETECTED NOT DETECTED   Candida glabrata NOT DETECTED NOT DETECTED   Candida krusei NOT DETECTED NOT DETECTED   Candida parapsilosis NOT DETECTED NOT DETECTED   Candida tropicalis NOT DETECTED NOT DETECTED   Cryptococcus neoformans/gattii NOT DETECTED NOT DETECTED    Thank you for allowing pharmacy to be a part of this patient's care.  Alycia Rossetti, PharmD, BCPS Clinical Pharmacist Clinical phone for 05/31/2020: [phone] 05/31/2020 10:18 AM   **Pharmacist phone directory can now be found on [email or web address] ([email or web address]).  Listed under [place].

## 2020-05-31 NOTE — Progress Notes (Signed)
PROGRESS NOTE    Irem Stoneham  NIO:[id number] DOB: Dec 24, 1927 DOA: 05/29/2020 PCP: Debbrah Alar, NP   Brief Narrative:  HPI per Dr. Lorella Nimrod on 05/30/20 Henryetta Corriveau is a 86 y.o. female with medical history significant of advanced dementia, hypertension, breast cancer and lymphoma survivor was brought to [facility]  with complaint of some cough and upper respiratory symptoms more prominent with eating and drinking for the past 1 week. Patient was unable to provide any history, which was obtained on phone from her daughter who is also her POA and patient lives with her.  According to her she was having some cough especially with eating and drinking and increased drooling for the past 1 week.  Denies any fever or chills.  Denies any sick contacts.  Denies any nausea and vomiting.  There was some concern of facial drop at urgent care but family never noticed any changes, CT head was done and it was negative for any acute infarct.  Per daughter patient is completely dependent for all her ADLs, recently keeping food in her mouth, occasionally spitting out. Daughter is not aware of any urinary symptoms.  Poor p.o. intake. Amoxicillin allergies were noted in her chart but daughter is not aware of any allergies. Per daughter she was on ramipril 10 mg daily which was taken off recently by her provider as her blood pressure was low.  ED Course: Hemodynamically stable, labs pertinent for leukocytosis, chest x-ray with right basilar consolidation concerning for aspiration pneumonia.  CT head was negative for any acute infarct.  **Interim History   Assessment & Plan:   Active Problems:   Aspiration pneumonia (HCC)  Aspiration pneumonia, poA [place] complicated by Suspected Bacteremia -Patient received Zithromax and ceftriaxone in ED. -Not Septic on Admission -Amoxicillin allergy was listed in her chart but daughter was not aware of any serious allergies.  Concern of aspiration with eating and  drinking.   -CT head negative for any acute infarct. -Kept n.p.o. initially but MBS was done and speech therapy now recommending dysphagia 2 diet with thin liquids with medication administration and crushed with pure and liquid administration throughout a strong a cup -Started her on Unasyn will continue for now -WBC went from 23.2 -> 22.5 -> 15.3 and improving and she is afebrile -Urinalysis was relatively unremarkable and showed a clear appearance with trace hemoglobin, small leukocytes, negative nitrites, few bacteria, 6-10 RBCs per high-power field, 0-5 squamous epithelial cells and 6-10 WBCs -Normal saline at 75 mL/h and will be stopped after 1 day -Blood cultures x2 obtained and showed Staphylococcus and Streptococcal species possibly contaminated but we will continue IV Unasyn for now; will repeat blood cultures x2 now -Follow-up on repeat blood cultures -The patient's WBC is trending down and she is afebrile  Advanced Dementia with Behavioral Disturbances -Patient is oriented to name only.  Completely dependent for all her ADL, lives with her daughter.   -Per daughter she will come back home, she is not interested in any facility but per nursing she lives most of the day by herself so we will need PT and OT to evaluate further and have case management evaluate if there is a safe discharge disposition for her home -Continue home dose of donepezil 5 mg p.o. nightly. -Received IV Lorazepam 0.5 mg x1 last night and Haldol was not used due to   Chronic back pain.  -Continue home dose of Tramadol 50 mg p.o. every 12 as needed moderate and severe as needed.  Hypertension -Patient was  on Ramipril 10 mg daily at home and recently taken off by her PCP due to labile blood pressure. -Continue to monitor without any medication at this time. -Continue monitor blood pressures per protocol -Last blood pressure was 165/83  Abnormal LFTs -Patient's AST was 82 on admission her ALT was 46 and is  likely reactive in the setting of possible dehydration Continue monitor and trend hepatic function panel repeat CMP in a.m. If still persistently elevated will obtain a right upper quadrant ultrasound as well as an acute hepatitis panel -Repeat CMP in a.m.  Hyponatremia -In the setting of dehydration -Patient's sodium level improved from 133 is now 139 with IV fluid hydration -We will continue IV fluid hydration as above and will stop later today  Normocytic Anemia -Patient's hemoglobin/hematocrit went from 11.8/36.6 on admission -> 11.3/35.2 -> 13.0/39.0 -Anemia panel in a.m.-continuing IV for hydration for now and will stop later today -Continue to monitor for signs and symptoms of bleeding; currently no overt bleeding noted -Repeat CBC in a.m.  Metabolic Acidosis -Mild -Patient's CO2 is now 19, anion gap is 15, chloride level 105-continue IV for hydration and will stop later today -Continue to monitor and trend repeat CMP in a.m.  Hyperglycemia -Likely reactive but will need to rule out Diabetes -Check HbA1c in the AM  -Continue to Monitor Blood Sugars carefully and if necessary will place on Sensitive Novolog SSI AC -Patient's Blood Sugar has been ranging from 109-120 on Daily BMP/CMP  GOC: DNR, poA  DVT prophylaxis: Enoxaparin 40 mg sq q24h Code Status: DO NOT RESUSCITATE Family Communication: No family present at bedside but called Daughter and spoke to her over the phone Disposition Plan: Pending further evaluation by PT and OT  Status is: Inpatient  Remains inpatient appropriate because:Unsafe d/c plan, IV treatments appropriate due to intensity of illness or inability to take PO and Inpatient level of care appropriate due to severity of illness   Dispo: The patient is from: Home              Anticipated d/c is to: Home (Family does not want SNF)              Anticipated d/c date is: 2 days              Patient currently is not medically stable to  d/c.  Consultants:   None  Procedures: None  Antimicrobials:  Anti-infectives (From admission, onward)   Start     Dose/Rate Route Frequency Ordered Stop   05/30/20 2230  cefTRIAXone (ROCEPHIN) 1 g in sodium chloride 0.9 % 100 mL IVPB  Status:  Discontinued        1 g 200 mL/hr over 30 Minutes Intravenous Every 24 hours 05/30/20 2010 05/30/20 2017   05/30/20 2230  doxycycline (VIBRAMYCIN) 100 mg in sodium chloride 0.9 % 250 mL IVPB  Status:  Discontinued        100 mg 125 mL/hr over 120 Minutes Intravenous Every 12 hours 05/30/20 2013 05/30/20 2017   05/30/20 2130  Ampicillin-Sulbactam (UNASYN) 3 g in sodium chloride 0.9 % 100 mL IVPB        3 g 200 mL/hr over 30 Minutes Intravenous Every 8 hours 05/30/20 2030     05/30/20 1830  Ampicillin-Sulbactam (UNASYN) 3 g in sodium chloride 0.9 % 100 mL IVPB  Status:  Discontinued        3 g 200 mL/hr over 30 Minutes Intravenous Every 8 hours 05/30/20 1732 05/30/20 2010   05/29/20  2215  cefTRIAXone (ROCEPHIN) 1 g in sodium chloride 0.9 % 100 mL IVPB        1 g 200 mL/hr over 30 Minutes Intravenous  Once 05/29/20 2213 05/29/20 2313   05/29/20 2215  azithromycin (ZITHROMAX) 500 mg in sodium chloride 0.9 % 250 mL IVPB        500 mg 250 mL/hr over 60 Minutes Intravenous  Once 05/29/20 2213 05/29/20 2348       Subjective: Seen and examined at bedside and she is extremely confused and pleasantly demented.  Wanting to ambulate to the restroom.  No nausea or vomiting.  Denies any chest pain, NS or dizziness.  No other concerns or complaints at this time.  Objective: Vitals:   05/30/20 1530 05/30/20 1711 05/30/20 2205 05/31/20 0524  BP: (!) 150/63 (!) 178/78 (!) 152/82 (!) 165/83  Pulse: 99 (!) 102 98 77  Resp: 16 18 20 18   Temp:  97.9 F (36.6 C) 97.9 F (36.6 C) 97.7 F (36.5 C)  TempSrc:  Axillary Axillary Axillary  SpO2: 93% 95% 97% 96%  Weight:      Height:        Intake/Output Summary (Last 24 hours) at 05/31/2020 0840 Last  data filed at 05/31/2020 0346 Gross per 24 hour  Intake --  Output 150 ml  Net -150 ml   Filed Weights   05/29/20 2049  Weight: 49.4 kg   Examination: Physical Exam:  Constitutional: Thin frail elderly pleasantly demented Caucasian female currently in no acute distress placed appears slightly uncomfortable though Eyes: Lids and conjunctivae normal, sclerae anicteric  ENMT: External Ears, Nose appear normal. Grossly normal hearing.  Neck: Appears normal, supple, no cervical masses, normal ROM, no appreciable thyromegaly; no JVD Respiratory: Diminished to auscultation bilaterally with coarse breath sounds, no wheezing, rales, rhonchi or crackles. Normal respiratory effort and patient is not tachypenic. No accessory muscle use.  Unlabored breathing Cardiovascular: RRR, no murmurs / rubs / gallops. S1 and S2 auscultated.  No appreciable family edema Abdomen: Soft, non-tender, non-distended. Bowel sounds positive.  GU: Deferred. Musculoskeletal: No clubbing / cyanosis of digits/nails. No joint deformity upper and lower extremities.  Skin: No rashes, lesions, ulcers on limited skin evaluation but does have some bruising and ecchymosis upper extremities. No induration; Warm and dry.  Neurologic: CN 2-12 grossly intact with no focal deficits. Romberg sign cerebellar reflexes not assessed.  Psychiatric: Impaired judgment and insight.  She is awake but she is not alert and oriented x 3. Normal mood and appropriate affect.   Data Reviewed: I have personally reviewed following labs and imaging studies  CBC: Recent Labs  Lab 05/29/20 2110 05/30/20 1918 05/31/20 0200  WBC 23.2* 22.5* 15.3*  NEUTROABS 13.8*  --   --   HGB 11.8* 11.3* 13.0  HCT 36.6 35.2* 39.0  MCV 87.1 85.2 84.8  PLT 367 366 588   Basic Metabolic Panel: Recent Labs  Lab 05/29/20 2110 05/30/20 1918 05/31/20 0200  NA 133*  --  139  K 3.6  --  3.5  CL 97*  --  105  CO2 25  --  19*  GLUCOSE 120*  --  109*  BUN 17  --   10  CREATININE 0.86 0.73 0.76  CALCIUM 8.7*  --  9.0   GFR: Estimated Creatinine Clearance: 36.4 mL/min (by C-G formula based on SCr of 0.76 mg/dL). Liver Function Tests: Recent Labs  Lab 05/29/20 2110  AST 82*  ALT 46*  ALKPHOS 109  BILITOT 0.2*  PROT 7.6  ALBUMIN 3.0*   No results for input(s): LIPASE, AMYLASE in the last 168 hours. No results for input(s): AMMONIA in the last 168 hours. Coagulation Profile: Recent Labs  Lab 05/29/20 2110  INR 1.0   Cardiac Enzymes: No results for input(s): CKTOTAL, CKMB, CKMBINDEX, TROPONINI in the last 168 hours. BNP (last 3 results) No results for input(s): PROBNP in the last 8760 hours. HbA1C: No results for input(s): HGBA1C in the last 72 hours. CBG: No results for input(s): GLUCAP in the last 168 hours. Lipid Profile: No results for input(s): CHOL, HDL, LDLCALC, TRIG, CHOLHDL, LDLDIRECT in the last 72 hours. Thyroid Function Tests: No results for input(s): TSH, T4TOTAL, FREET4, T3FREE, THYROIDAB in the last 72 hours. Anemia Panel: No results for input(s): VITAMINB12, FOLATE, FERRITIN, TIBC, IRON, RETICCTPCT in the last 72 hours. Sepsis Labs: No results for input(s): PROCALCITON, LATICACIDVEN in the last 168 hours.  Recent Results (from the past 240 hour(s))  Resp Panel by RT-PCR (Flu A&B, Covid) Nasopharyngeal Swab     Status: None   Collection Time: 05/29/20  9:10 PM   Specimen: Nasopharyngeal Swab; Nasopharyngeal(NP) swabs in vial transport medium  Result Value Ref Range Status   SARS Coronavirus 2 by RT PCR NEGATIVE NEGATIVE Final    Comment: (NOTE) SARS-CoV-2 target nucleic acids are NOT DETECTED.  The SARS-CoV-2 RNA is generally detectable in upper respiratory specimens during the acute phase of infection. The lowest concentration of SARS-CoV-2 viral copies this assay can detect is 138 copies/mL. A negative result does not preclude SARS-Cov-2 infection and should not be used as the sole basis for treatment  or other patient management decisions. A negative result may occur with  improper specimen collection/handling, submission of specimen other than nasopharyngeal swab, presence of viral mutation(s) within the areas targeted by this assay, and inadequate number of viral copies(<138 copies/mL). A negative result must be combined with clinical observations, patient history, and epidemiological information. The expected result is Negative.  Fact Sheet for Patients:  EntrepreneurPulse.com.au  Fact Sheet for Healthcare Providers:  IncredibleEmployment.be  This test is no t yet approved or cleared by the [place] FDA and  has been authorized for detection and/or diagnosis of SARS-CoV-2 by FDA under an Emergency Use Authorization (EUA). This EUA will remain  in effect (meaning this test can be used) for the duration of the COVID-19 declaration under Section 564(b)(1) of the Act, 21 U.S.C.section 360bbb-3(b)(1), unless the authorization is terminated  or revoked sooner.       Influenza A by PCR NEGATIVE NEGATIVE Final   Influenza B by PCR NEGATIVE NEGATIVE Final    Comment: (NOTE) The Xpert Xpress SARS-CoV-2/FLU/RSV plus assay is intended as an aid in the diagnosis of influenza from Nasopharyngeal swab specimens and should not be used as a sole basis for treatment. Nasal washings and aspirates are unacceptable for Xpert Xpress SARS-CoV-2/FLU/RSV testing.  Fact Sheet for Patients: EntrepreneurPulse.com.au  Fact Sheet for Healthcare Providers: IncredibleEmployment.be  This test is not yet approved or cleared by the [place] FDA and has been authorized for detection and/or diagnosis of SARS-CoV-2 by FDA under an Emergency Use Authorization (EUA). This EUA will remain in effect (meaning this test can be used) for the duration of the COVID-19 declaration under Section 564(b)(1) of the Act, 21 U.S.C. section  360bbb-3(b)(1), unless the authorization is terminated or revoked.  Performed at [facility], [address] [place], [place] [address]   Culture, blood (routine x 2)  Status: None (Preliminary result)   Collection Time: 05/29/20 10:10 PM   Specimen: BLOOD  Result Value Ref Range Status   Specimen Description   Final    BLOOD RIGHT ANTECUBITAL Performed at [facility], [place] [address], [place], [place] [address]    Special Requests   Final    BOTTLES DRAWN AEROBIC AND ANAEROBIC Blood Culture adequate volume Performed at [facility], [address] [place], [place] [address]    Culture PENDING  Incomplete   Report Status PENDING  Incomplete  Culture, blood (routine x 2)     Status: None (Preliminary result)   Collection Time: 05/29/20 10:13 PM   Specimen: BLOOD RIGHT WRIST  Result Value Ref Range Status   Specimen Description   Final    BLOOD RIGHT WRIST Performed at [facility], [address] [place], [place] [address]    Special Requests   Final    BOTTLES DRAWN AEROBIC AND ANAEROBIC Blood Culture results may not be optimal due to an inadequate volume of blood received in culture bottles Performed at [facility], [address] [place], [place] [address]    Culture  Setup Time   Final    GRAM POSITIVE COCCI IN CHAINS AEROBIC BOTTLE ONLY Organism ID to follow Performed at [facility], [place] [address], [place], [place] [address]    Culture GRAM POSITIVE COCCI  Final   Report Status PENDING  Incomplete     RN Pressure Injury Documentation:     Estimated body mass index is 18.69 kg/m as calculated from the following:   Height as of this encounter: 5\' 4"  (1.626 m).   Weight as of this encounter: 49.4 kg.  Malnutrition Type:    Malnutrition Characteristics:   Nutrition Interventions:     Radiology Studies: CT Head Wo Contrast  Result Date: 05/29/2020 CLINICAL DATA:  Acute neurologic deficit,  stroke, hypertension, breast cancer EXAM: CT HEAD WITHOUT CONTRAST TECHNIQUE: Contiguous axial images were obtained from the base of the skull through the vertex without intravenous contrast. COMPARISON:  MRI 06/18/2017 FINDINGS: Brain: Normal anatomic configuration. Moderate parenchymal volume loss is commensurate with the patient's age. The degree of parenchymal volume loss has progressed since prior examination, however. Mild periventricular white matter changes are present likely reflecting the sequela of small vessel ischemia. Tiny remote infarct noted within the right cerebellar hemisphere no abnormal intra or extra-axial mass lesion or fluid collection. No abnormal mass effect or midline shift. No evidence of acute intracranial hemorrhage or infarct. The ventricles are mildly dilated, stable since prior examination and commensurate with the degree of parenchymal volume loss. Cerebellum unremarkable. Vascular: No asymmetric hyperdense vasculature at the skull base. Skull: Intact Sinuses/Orbits: There is dense opacification of the left maxillary sinus as well as multiple left ethmoid air cells. Mild mucosal thickening is seen within the frontal sinuses, left greater than right. The orbits are unremarkable. Other: There is fluid opacification of the left mastoid air cells without associated osseous erosion, similar to that noted on prior examination. Right mastoid air cells are clear. Middle ear cavities are clear. IMPRESSION: No evidence of acute intracranial hemorrhage or infarct. Advanced senescent changes with progressive parenchymal atrophy since prior examination. Mild ventriculomegaly is stable and likely reflects the sequela of central atrophy. Stable left mastoid effusion. Extensive predominantly left-sided paranasal sinus disease with near complete opacification of the left maxillary sinus. Electronically Signed   By: Fidela Salisbury MD  On: 05/29/2020 21:59   DG Chest Port 1 View  Result Date:  05/29/2020 CLINICAL DATA:  Cough, chest congestion EXAM: PORTABLE CHEST 1 VIEW COMPARISON:  01/05/2014 FINDINGS: There is focal consolidation at the right lung base, possibly infectious in the appropriate clinical setting. Small bilateral pleural effusions are present, left greater than right. Cardiac size is within normal limits. Pulmonary vascularity is normal. No pneumothorax. No acute bone abnormality. Surgical clips are seen within the left axilla. IMPRESSION: Right basilar consolidation possibly reflecting changes of acute lobar pneumonia in the appropriate clinical setting. Small bilateral pleural effusions. Electronically Signed   By: Fidela Salisbury MD   On: 05/29/2020 21:52   Scheduled Meds: . donepezil  5 mg Oral QHS  . enoxaparin (LOVENOX) injection  30 mg Subcutaneous Q24H   Continuous Infusions: . sodium chloride 75 mL/hr at 05/30/20 1959  . ampicillin-sulbactam (UNASYN) IV 3 g (05/31/20 0529)     LOS: 1 day   Kerney Elbe, DO [facility] PAGER is on [place]  If 7PM-7AM, please contact night-coverage [email or web address]

## 2020-05-31 NOTE — Progress Notes (Signed)
Modified Barium Swallow Progress Note  Patient Details  Name: Joan Hill MRN: [id number] Date of Birth: [birth year]  Today's Date: 05/31/2020  Modified Barium Swallow completed.  Full report located under Chart Review in the Imaging Section.  Brief recommendations include the following:  Clinical Impression  Pt was seen in radiology suite for modified barium swallow study. Trials of puree solids, regular texture solids, and thin liquids via cup and straw were administered. She demonstrated oral phase dysphagia characterized by reduced posterior bolus propulsion with lingual rocking. Coughing was noted once during the study; however, the pharyngeal phase of her swallow was within normal limits and no instances of penetration or aspiration were noted. It is recommended that a dysphagia 2 diet with thin liquids be initiated at this time. SLP will see patient once more to ensure tolerance of the diet; however, further skilled SLP services will likely not be needed beyond that.   Swallow Evaluation Recommendations       SLP Diet Recommendations: Dysphagia 2 (Fine chop) solids;Thin liquid   Liquid Administration via: Straw;Cup   Medication Administration: Crushed with puree   Supervision: Staff to assist with self feeding   Compensations: Slow rate;Minimize environmental distractions   Postural Changes: Remain semi-upright after after feeds/meals (Comment)   Oral Care Recommendations: Oral care BID      Alaycia Eardley I. Hardin Negus, [place], [place] Office number [phone] Pager [place] 05/31/2020,1:31 PM

## 2020-05-31 NOTE — Plan of Care (Signed)

## 2020-05-31 NOTE — TOC Initial Note (Addendum)
Transition of Care [facility]) - Initial/Assessment Note    Patient Details  Name: Joan Hill MRN: [id number] Date of Birth: 1927-07-07  Transition of Care [facility]) CM/SW Contact:    Angelita Ingles, RN Phone Number: [phone]  05/31/2020, 1:54 PM  Clinical Narrative:                 CM consulted for [facility] /DME/ or placement needs. CM at bedside for assessment but patient does not give verbal response to any questions. CM has attempted to call daughter Joan Hill @ [phone] with no answer and a voicemail that has not been set up. TOC will continue to follow.   1510 CM attempted to call daughter [facility] with no answer. Message left on voicemail for daughter to call CM to discuss discharge planning. Will await return call.   1600 CM received call from patients daughter Joan Hill who state that the family has a lot of support and family members that are available to be with patient 24 hours/day. Daughter states that she has a son that lives in the home and daughters husband works part time and there is a retired son in Sports coach that can be with the patient. Daughter also states that she is not interested in therapy but would like to have [facility] aide. CM explained to daughter that [facility] aide would not be in the home for hours and would be limited to a few times a week to help. Daughter is open to the idea and would like for CM to determine what the aide will be able to do for the patient. CM will continue to follow and contact [place] agencies to determine services that will be provided by the [facility] aide.  Expected Discharge Plan: [place] Barriers to Discharge: Continued Medical Work up   Patient Goals and CMS Choice Patient states their goals for this hospitalization and ongoing recovery are:: Patient does not respond when asked      Expected Discharge Plan and Services Expected Discharge Plan: [place]       Living arrangements for the past 2 months: Single Family  Home                                      Prior Living Arrangements/Services Living arrangements for the past 2 months: Single Family Home Lives with:: Adult Children                   Activities of Daily Living Home Assistive Devices/Equipment: None ADL Screening (condition at time of admission) Patient's cognitive ability adequate to safely complete daily activities?: No Is the patient deaf or have difficulty hearing?: No Does the patient have difficulty seeing, even when wearing glasses/contacts?: No Does the patient have difficulty concentrating, remembering, or making decisions?: Yes Patient able to express need for assistance with ADLs?: No Does the patient have difficulty dressing or bathing?: Yes Independently performs ADLs?: No Communication: Appropriate for developmental age Dressing (OT): Needs assistance Is this a change from baseline?: Pre-admission baseline Grooming: Needs assistance Is this a change from baseline?: Pre-admission baseline Feeding: Needs assistance Is this a change from baseline?: Pre-admission baseline Bathing: Needs assistance Is this a change from baseline?: Pre-admission baseline Toileting: Independent with device (comment) In/Out Bed: Needs assistance Is this a change from baseline?: Pre-admission baseline Walks in Home: Independent with device (comment) Does the patient have difficulty walking  or climbing stairs?: Yes Weakness of Legs: Both Weakness of Arms/Hands: Both  Permission Sought/Granted                  Emotional Assessment              Admission diagnosis:  Cough [R05.9] Aspiration pneumonia ([place]) [J69.0] Community acquired pneumonia of right lower lobe of lung [J18.9] Patient Active Problem List   Diagnosis Date Noted  . Aspiration pneumonia ([place]) 05/29/2020  . Hypertension   . CLL (chronic lymphocytic leukemia) ([place])   . Chronic low back pain   . Memory loss    PCP:  Debbrah Alar,  NP Pharmacy:   [facility] [place], [place] [address] Phone: [phone] Fax: [phone]     Social Determinants of Health (SDOH) Interventions    Readmission Risk Interventions No flowsheet data found.

## 2020-05-31 NOTE — Evaluation (Addendum)
Clinical/Bedside Swallow Evaluation Patient Details  Name: Joan Hill MRN: [id number] Date of Birth: 1927/07/26  Today's Date: 05/31/2020 Time: SLP Start Time (ACUTE ONLY): 0846 SLP Stop Time (ACUTE ONLY): 0905 SLP Time Calculation (min) (ACUTE ONLY): 19 min  Past Medical History:  Past Medical History:  Diagnosis Date  . Breast cancer ([place])   . Chronic low back pain   . CLL (chronic lymphocytic leukemia) ([place])   . Dehydration   . Hypertension   . Memory loss   . Sacral fracture ([place]) 03/2019   Past Surgical History:  Past Surgical History:  Procedure Laterality Date  . MASTECTOMY     HPI:  Pt is a 86 y.o. female with medical history significant of advanced dementia, hypertension, breast cancer and lymphoma survivor who was brought to [facility] with complaint of some cough and upper respiratory symptoms more prominent with eating and drinking for the past week prior to admission. CXR 12/13: Right basilar consolidation possibly reflecting changes of acute lobar pneumonia in the appropriate clinical setting.   Assessment / Plan / Recommendation Clinical Impression  Pt was seen for bedside swallow evaluation. She exhibited difficulty following commands and her responses to questions were frequently unrelated to questions. Her reliability as a historian is therefore questioned, but she denied symptoms of oropharygeal dysphagia. A complete oral mechanism exam was not completed due to pt's difficulty following commands. Oral mucosa was dry and dentition limited. Lingual ROM appeared [facility]. She demonstrated prolonged mastication with regular textures, but stated that she is a slow eater. Pt was asymptomatic of aspiration; however, considering results of imaging, a modified barium swallow study is recommended to further assess swallow function. SLP Visit Diagnosis: Dysphagia, unspecified (R13.10)    Aspiration Risk  Mild aspiration risk    Diet Recommendation     Medication Administration:  Crushed with puree Supervision: Staff to assist with self feeding    Other  Recommendations Oral Care Recommendations: Oral care QID   Follow up Recommendations  (TBD)      Frequency and Duration min 2x/week  2 weeks       Prognosis Prognosis for Safe Diet Advancement: Good Barriers to Reach Goals: Cognitive deficits      Swallow Study   General Date of Onset: 05/30/20 HPI: Pt is a 86 y.o. female with medical history significant of advanced dementia, hypertension, breast cancer and lymphoma survivor who was brought to [facility] with complaint of some cough and upper respiratory symptoms more prominent with eating and drinking for the past week prior to admission. CXR 12/13: Right basilar consolidation possibly reflecting changes of acute lobar pneumonia in the appropriate clinical setting. Type of Study: Bedside Swallow Evaluation Previous Swallow Assessment: None Diet Prior to this Study: NPO Temperature Spikes Noted: No Respiratory Status: Room air History of Recent Intubation: No Behavior/Cognition: Alert;Cooperative;Confused;Doesn't follow directions;Requires cueing Oral Cavity Assessment: Dry Oral Care Completed by SLP: Recent completion by staff Oral Cavity - Dentition: Missing dentition Vision: Functional for self-feeding Self-Feeding Abilities: Needs assist Patient Positioning: Upright in bed;Postural control adequate for testing Baseline Vocal Quality: Normal Volitional Swallow: Able to elicit    Oral/Motor/Sensory Function Overall Oral Motor/Sensory Function:  (Pt demonstrated difficulty following the necessary commands.)   Ice Chips Ice chips: Within functional limits Presentation: Spoon   Thin Liquid Thin Liquid: Within functional limits Presentation: Straw    Nectar Thick Nectar Thick Liquid: Not tested   Honey Thick Honey Thick Liquid: Not tested   Puree Puree: Within functional limits Presentation: Spoon   Solid  Solid: Impaired Oral Phase Impairments:  Impaired mastication Oral Phase Functional Implications: Impaired mastication     Auburn Hester I. Hardin Negus, [place], [place] Office number [phone] Pager [phone]  Joan Hill 05/31/2020,9:13 AM

## 2020-05-31 NOTE — Evaluation (Signed)
Physical Therapy Evaluation Patient Details Name: Joan Hill MRN: [id number] DOB: [birth year] Today's Date: 05/31/2020   History of Present Illness  86 y.o. female with medical history significant of advanced dementia, hypertension, breast cancer and lymphoma survivor was brought to [facility]  with complaint of some cough and upper respiratory symptoms more prominent with eating and drinking for the past 1 week. Pt found to have aspiration PNA.  Clinical Impression  Pt presents to PT with deficits in cognition, strength, power, gait, balance, endurance. Pt currently requires significant assistance to perform functional mobility tasks with more assistance needed during transfer attempts. Pt is at a high falls risk currently due to weakness and impaired awareness of safety and deficits. PT spoke with pt's daughter Earnest Bailey over the phone and the dtr provides all history as listed below. Family desires the pt to return home, not wanting SNF rehab, and would like to look into nursing aide assistance in the home during the day when family can not be present.    Follow Up Recommendations SNF;Supervision/Assistance - 24 hour (family declining SNF recs, will benefit from HHPT and 24/7 assistance)    Equipment Recommendations  Other (comment) (mechanical lift)    Recommendations for Other Services       Precautions / Restrictions Precautions Precautions: Fall Restrictions Weight Bearing Restrictions: No      Mobility  Bed Mobility Overal bed mobility: Needs Assistance Bed Mobility: Rolling;Sidelying to Sit;Sit to Supine Rolling: Min assist Sidelying to sit: Min assist   Sit to supine: Mod assist        Transfers Overall transfer level: Needs assistance Equipment used: Rolling walker (2 wheeled) Transfers: Sit to/from Stand Sit to Stand: Max assist         General transfer comment: pt requires significant assistance to power up into standing  Ambulation/Gait                 Stairs            Wheelchair Mobility    Modified Rankin (Stroke Patients Only)       Balance Overall balance assessment: Needs assistance Sitting-balance support: Single extremity supported;Feet supported Sitting balance-Leahy Scale: Poor Sitting balance - Comments: reliant on UE support of bed Postural control: Left lateral lean Standing balance support: Bilateral upper extremity supported Standing balance-Leahy Scale: Zero Standing balance comment: maxA with BUE support of RW                             Pertinent Vitals/Pain Pain Assessment: Faces Faces Pain Scale: No hurt    Home Living Family/patient expects to be discharged to:: Private residence Living Arrangements: Children Available Help at Discharge: Family;Available PRN/intermittently (family working durign the day per daughter Earnest Bailey) Type of Home: House Home Access: [place]: One [place]: Environmental consultant - 2 wheels;Bedside commode;Wheelchair - manual      Prior Function Level of Independence: Needs assistance   Gait / Transfers Assistance Needed: Dtr Earnest Bailey reports the pt often requires assistance for transfers as well as assistance to ambulate short household distances with use of a RW. Some days the family assists the pt in stand pivot transfers and wheelchair mobility.  ADL's / Homemaking Assistance Needed: assistance for all ADLs        Hand Dominance        Extremity/Trunk Assessment   Upper Extremity Assessment Upper Extremity Assessment: Generalized weakness    Lower Extremity  Assessment Lower Extremity Assessment: Generalized weakness    Cervical / Trunk Assessment Cervical / Trunk Assessment: Kyphotic  Communication   Communication: No difficulties  Cognition Arousal/Alertness: Awake/alert Behavior During Therapy: WFL for tasks assessed/performed Overall Cognitive Status: History of cognitive impairments - at baseline                                  General Comments: pt with dementia, oriented to person, does still recall family per daughter but does believe her mother is still living. Poor awareness of deficits and safety awareness. No recall of situation or place.      General Comments General comments (skin integrity, edema, etc.): VSS, pt noted to be incontinent of urine and stool upon arrival, pt reports continued need to have a bowel movement, does not know she has already defecated. PT relays information to RN.    Exercises     Assessment/Plan    PT Assessment Patient needs continued PT services  PT Problem List Decreased strength;Decreased activity tolerance;Decreased balance;Decreased mobility;Decreased cognition;Decreased knowledge of use of DME;Decreased safety awareness;Decreased knowledge of precautions       PT Treatment Interventions DME instruction;Gait training;Functional mobility training;Therapeutic activities;Therapeutic exercise;Balance training;Neuromuscular re-education;Cognitive remediation;Patient/family education;Wheelchair mobility training    PT Goals (Current goals can be found in the Care Plan section)  Acute Rehab PT Goals Patient Stated Goal: Family goal to discharge home with increased caregiver assistance PT Goal Formulation: With family Time For Goal Achievement: 06/14/20 Potential to Achieve Goals: Fair    Frequency Min 2X/week   Barriers to discharge Decreased caregiver support      Co-evaluation               AM-PAC PT "6 Clicks" Mobility  Outcome Measure Help needed turning from your back to your side while in a flat bed without using bedrails?: A Little Help needed moving from lying on your back to sitting on the side of a flat bed without using bedrails?: A Little Help needed moving to and from a bed to a chair (including a wheelchair)?: A Lot Help needed standing up from a chair using your arms (e.g., wheelchair or bedside chair)?: A Lot Help  needed to walk in hospital room?: Total Help needed climbing 3-5 steps with a railing? : Total 6 Click Score: 12    End of Session   Activity Tolerance: Patient tolerated treatment well Patient left: in bed;with call bell/phone within reach;with bed alarm set Nurse Communication: Mobility status;Need for lift equipment PT Visit Diagnosis: Other abnormalities of gait and mobility (R26.89);Muscle weakness (generalized) (M62.81)    Time: 5397-6734 PT Time Calculation (min) (ACUTE ONLY): 15 min   Charges:   PT Evaluation $PT Eval Moderate Complexity: 1 Mod          Zenaida Niece, PT, DPT Acute Rehabilitation Pager: [phone]   Zenaida Niece 05/31/2020, 2:43 PM

## 2020-06-01 ENCOUNTER — Inpatient Hospital Stay (HOSPITAL_COMMUNITY): Payer: Medicare Other

## 2020-06-01 DIAGNOSIS — R7881 Bacteremia: Secondary | ICD-10-CM

## 2020-06-01 DIAGNOSIS — D72829 Elevated white blood cell count, unspecified: Secondary | ICD-10-CM

## 2020-06-01 DIAGNOSIS — Z856 Personal history of leukemia: Secondary | ICD-10-CM

## 2020-06-01 LAB — CBC WITH DIFFERENTIAL/PLATELET
Abs Immature Granulocytes: 1.7 10*3/uL — ABNORMAL HIGH (ref 0.00–0.07)
Abs Immature Granulocytes: 0 10*3/uL (ref 0.00–0.07)
Basophils Absolute: 0.1 10*3/uL (ref 0.0–0.1)
Basophils Absolute: 0 10*3/uL (ref 0.0–0.1)
Basophils Relative: 0 %
Basophils Relative: 0 %
Eosinophils Absolute: 1.1 10*3/uL — ABNORMAL HIGH (ref 0.0–0.5)
Eosinophils Absolute: 0.7 10*3/uL — ABNORMAL HIGH (ref 0.0–0.5)
Eosinophils Relative: 3 %
Eosinophils Relative: 2 %
HCT: 33.7 % — ABNORMAL LOW (ref 36.0–46.0)
HCT: 35.5 % — ABNORMAL LOW (ref 36.0–46.0)
Hemoglobin: 11.3 g/dL — ABNORMAL LOW (ref 12.0–15.0)
Hemoglobin: 11.4 g/dL — ABNORMAL LOW (ref 12.0–15.0)
Immature Granulocytes: 5 %
Lymphocytes Relative: 8 %
Lymphocytes Relative: 8 %
Lymphs Abs: 2.8 10*3/uL (ref 0.7–4.0)
Lymphs Abs: 2.8 10*3/uL (ref 0.7–4.0)
MCH: 28 pg (ref 26.0–34.0)
MCH: 27.1 pg (ref 26.0–34.0)
MCHC: 33.5 g/dL (ref 30.0–36.0)
MCHC: 32.1 g/dL (ref 30.0–36.0)
MCV: 83.4 fL (ref 80.0–100.0)
MCV: 84.3 fL (ref 80.0–100.0)
Monocytes Absolute: 1.5 10*3/uL — ABNORMAL HIGH (ref 0.1–1.0)
Monocytes Absolute: 1.1 10*3/uL — ABNORMAL HIGH (ref 0.1–1.0)
Monocytes Relative: 4 %
Monocytes Relative: 3 %
Neutro Abs: 28.7 10*3/uL — ABNORMAL HIGH (ref 1.7–7.7)
Neutro Abs: 30.5 10*3/uL — ABNORMAL HIGH (ref 1.7–7.7)
Neutrophils Relative %: 80 %
Neutrophils Relative %: 87 %
Platelets: 416 10*3/uL — ABNORMAL HIGH (ref 150–400)
Platelets: 469 10*3/uL — ABNORMAL HIGH (ref 150–400)
RBC: 4.04 MIL/uL (ref 3.87–5.11)
RBC: 4.21 MIL/uL (ref 3.87–5.11)
RDW: 15.9 % — ABNORMAL HIGH (ref 11.5–15.5)
RDW: 15.8 % — ABNORMAL HIGH (ref 11.5–15.5)
WBC: 35.8 10*3/uL — ABNORMAL HIGH (ref 4.0–10.5)
WBC: 35.1 10*3/uL — ABNORMAL HIGH (ref 4.0–10.5)
nRBC: 0 % (ref 0.0–0.2)
nRBC: 0 % (ref 0.0–0.2)
nRBC: 1 /100 WBC — ABNORMAL HIGH

## 2020-06-01 LAB — ECHOCARDIOGRAM COMPLETE
Area-P 1/2: 4.36 cm2
Height: 64 in
P 1/2 time: 161 msec
S' Lateral: 3.1 cm
Single Plane A4C EF: 58 %
Weight: 1742.52 oz

## 2020-06-01 LAB — COMPREHENSIVE METABOLIC PANEL
ALT: 43 U/L (ref 0–44)
AST: 36 U/L (ref 15–41)
Albumin: 2.5 g/dL — ABNORMAL LOW (ref 3.5–5.0)
Alkaline Phosphatase: 82 U/L (ref 38–126)
Anion gap: 15 (ref 5–15)
BUN: 11 mg/dL (ref 8–23)
CO2: 20 mmol/L — ABNORMAL LOW (ref 22–32)
Calcium: 8.5 mg/dL — ABNORMAL LOW (ref 8.9–10.3)
Chloride: 105 mmol/L (ref 98–111)
Creatinine, Ser: 0.71 mg/dL (ref 0.44–1.00)
GFR, Estimated: >60 mL/min (ref >60)
Glucose, Bld: 127 mg/dL — ABNORMAL HIGH (ref 70–99)
Potassium: 3.5 mmol/L (ref 3.5–5.1)
Sodium: 140 mmol/L (ref 135–145)
Total Bilirubin: 0.6 mg/dL (ref 0.3–1.2)
Total Protein: 6.2 g/dL — ABNORMAL LOW (ref 6.5–8.1)

## 2020-06-01 LAB — MAGNESIUM: Magnesium: 1.9 mg/dL (ref 1.7–2.4)

## 2020-06-01 LAB — PHOSPHORUS: Phosphorus: 2.9 mg/dL (ref 2.5–4.6)

## 2020-06-01 LAB — C-REACTIVE PROTEIN: CRP: 11.9 mg/dL — ABNORMAL HIGH (ref <1.0)

## 2020-06-01 LAB — PATHOLOGIST SMEAR REVIEW

## 2020-06-01 LAB — SEDIMENTATION RATE: Sed Rate: 78 mm/hr — ABNORMAL HIGH (ref 0–22)

## 2020-06-01 MED ORDER — PIPERACILLIN-TAZOBACTAM 3.375 G IVPB
3.3750 g | Freq: Three times a day (TID) | INTRAVENOUS | Status: DC
  Administered 2020-06-01 – 2020-06-02 (×3): 3.375 g via INTRAVENOUS
  Filled 2020-06-01 (×3): qty 50

## 2020-06-01 MED ORDER — IOHEXOL 300 MG/ML  SOLN
75.0000 mL | Freq: Once | INTRAMUSCULAR | Status: AC | PRN
  Administered 2020-06-01: 75 mL via INTRAVENOUS

## 2020-06-01 NOTE — Progress Notes (Addendum)
  Speech Language Pathology Treatment: Dysphagia  Patient Details Name: Joan Hill MRN: [id number] DOB: 27-Feb-1928 Today's Date: 06/01/2020 Time: 0301-3143 SLP Time Calculation (min) (ACUTE ONLY): 20 min  Assessment / Plan / Recommendation Clinical Impression  Pt was seen for dysphagia treatment. Pt's nurse and nurse tech reported that she has been tolerating the current diet without overt s/sx of aspiration but stated that some solids have been too "chewy" to masticate. She was seen during lunch and consumed a meal of minced green beans, minced meatloaf, mashed potatoes, sherbet, and thin liquids via straw. Coughing was intermittently noted during the session, but she presents with a baseline cough and coughing was unrelated to swallowing during the modified barium swallow study on 12/15. Mastication time was mildly increased but functional. Mild lingual residue was cleared with a liquid wash. Pt's daughter, Arrie Aran, was contacted via phone and she described a diet similar to dysphagia 2 as the pt's diet at home, but stated that do not give her meat often due to difficulty with mastication. Per the description of pt's family, pt's swallow function appears to be at baseline. Pt's daughter was educated regarding the results of the swallow study and recommendations. She verbalized understanding and agreement. Further skilled SLP services are not clinically indicated at this time.    HPI HPI: Pt is a 86 y.o. female with medical history significant of advanced dementia, hypertension, breast cancer and lymphoma survivor who was brought to [facility] with complaint of some cough and upper respiratory symptoms more prominent with eating and drinking for the past week prior to admission. CXR 12/13: Right basilar consolidation possibly reflecting changes of acute lobar pneumonia in the appropriate clinical setting.      SLP Plan  Discharge SLP treatment due to (comment);All goals met       Recommendations   Diet recommendations: Dysphagia 2 (fine chop);Thin liquid Liquids provided via: Cup;Straw Medication Administration: Crushed with puree                Oral Care Recommendations: Oral care QID Follow up Recommendations: None SLP Visit Diagnosis: Dysphagia, unspecified (R13.10) Plan: Discharge SLP treatment due to (comment);All goals met       Lesleigh Hughson I. Hardin Negus, [place], [place] Office number [phone] Pager [place] 06/01/2020, 1:31 PM

## 2020-06-01 NOTE — Progress Notes (Addendum)
PROGRESS NOTE    Joan Hill  BDZ:[id number] DOB: 1927/11/12 DOA: 05/29/2020 PCP: Debbrah Alar, NP   Brief Narrative:  HPI per Dr. Lorella Nimrod on 05/30/20 Joan Hill is a 86 y.o. female with medical history significant of advanced dementia, hypertension, breast cancer and lymphoma survivor was brought to [facility]  with complaint of some cough and upper respiratory symptoms more prominent with eating and drinking for the past 1 week. Patient was unable to provide any history, which was obtained on phone from her daughter who is also her POA and patient lives with her.  According to her she was having some cough especially with eating and drinking and increased drooling for the past 1 week.  Denies any fever or chills.  Denies any sick contacts.  Denies any nausea and vomiting.  There was some concern of facial drop at urgent care but family never noticed any changes, CT head was done and it was negative for any acute infarct.  Per daughter patient is completely dependent for all her ADLs, recently keeping food in her mouth, occasionally spitting out. Daughter is not aware of any urinary symptoms.  Poor p.o. intake. Amoxicillin allergies were noted in her chart but daughter is not aware of any allergies. Per daughter she was on ramipril 10 mg daily which was taken off recently by her provider as her blood pressure was low.  ED Course: Hemodynamically stable, labs pertinent for leukocytosis, chest x-ray with right basilar consolidation concerning for aspiration pneumonia.  CT head was negative for any acute infarct.  **Interim History Her white count was initially trending down but then significantly worsened so we obtained an ESR and a CRP and ESR was 78 and CRP was 9.9.  I spoke to ID about the patient's blood cultures and Dr. Tommy Medal feels that they may have been contaminants but repeat blood culture still pending.  He recommended obtaining a CT scan of the chest as well as an  echocardiogram for completion this has been done.  CT chest with contrast is pending read and echocardiogram has been done. She has been afebrile. CXR was concerning for mass or neoplasm so will follow up on the CT Scan.   Assessment & Plan:   Active Problems:   Aspiration pneumonia (HCC)  Aspiration pneumonia, poA vs. Neoplasm possibly complicated by Suspected ? Bacteremia -Patient received Zithromax and ceftriaxone in ED. -Not Septic on Admission -Amoxicillin allergy was listed in her chart but daughter was not aware of any serious allergies.  Concern of aspiration with eating and drinking.   -CT head negative for any acute infarct.  Will obtain a CT of the chest with contrast -Kept n.p.o. initially but MBS was done and speech therapy now recommending dysphagia 2 diet with thin liquids with medication administration and crushed with pure and liquid administration throughout a strong a cup -C/w Aspiration Precautions -Started her on Unasyn will continue for now but since her WBC has worsened we will escalate to IV Zosyn at the recommendations of infectious diseases -WBC went from 23.2 -> 22.5 -> 15.3 and was improving but significantly worsened now to 35.8; she remains afebrile -Urinalysis was relatively unremarkable and showed a clear appearance with trace hemoglobin, small leukocytes, negative nitrites, few bacteria, 6-10 RBCs per high-power field, 0-5 squamous epithelial cells and 6-10 WBCs -Normal saline at 75 mL/hr stopped -Blood cultures x2 obtained and showed Staphylococcus and Streptococcal species possibly contaminated but we will continue IV Unasyn for now; Repeat blood cultures x2 pending -CXR  this AM showed "Rounded density seen in right infrahilar region concerning for possible mass or neoplasm. Bibasilar atelectasis or edema is noted with possible small right pleural effusion. CT scan of the chest is recommended for further evaluation." -The case was discussed with infectious  diseases Dr. Tommy Medal and he recommended following up on the blood cultures, obtaining echocardiogram as well as a CT of the chest -Follow-up on repeat blood cultures -The patient's WBC was trending down but now gone up; Will obtain CT and repeat CBC in the AM -? Elevation of WBC in the setting of her CLL but after further review and review of her lymphocyte count this is highly unlikely as her lymphocyte count has improved.  She predominately has a neutrophilia; case was discussed with oncology and feels that she could have a mass on [place] that could cause a paraneoplastic neutrophilia however this will take weeks and this is likely an acute infection. -Follow up on CT Scan and repeat CBC in the AM   Advanced Dementia with Behavioral Disturbances -Patient is oriented to name only.  Completely dependent for all her ADL, lives with her daughter.   -Per daughter she will come back home, she is not interested in any facility but per nursing she lives most of the day by herself so we will need PT and OT to evaluate further and have case management evaluate if there is a safe discharge disposition for her home -Continue home dose of donepezil 5 mg p.o. nightly. -Received IV Lorazepam 0.5 mg x1 the night before last and Haldol was not used due to prolonged QT  Chronic Back pain.  -Continue home dose of Tramadol 50 mg p.o. every 12 as needed moderate and severe as needed.  Hypertension -Patient was on Ramipril 10 mg daily at home and recently taken off by her PCP due to labile blood pressure. -Continue to monitor without any medication at this time. -Continue monitor blood pressures per protocol -Last blood pressure was 145/70  Abnormal LFTs -Patient's AST was 82 on admission her ALT was 46 and is likely reactive in the setting of possible dehydration; Now AST is 36 and ALT is 43 -Continue monitor and trend hepatic function panel repeat CMP in a.m. -If still persistently elevated will obtain a  right upper quadrant ultrasound as well as an acute hepatitis panel -Repeat CMP in a.m.  Hyponatremia -In the setting of dehydration -Patient's sodium level improved from 133 is now 140 with IV fluid hydration -We will continue IV fluid hydration as above and will stop later today  Normocytic Anemia -Patient's hemoglobin/hematocrit went from 11.8/36.6 on admission -> 11.3/35.2 -> 13.0/39.0 -> 11.3/33.7 -Check Anemia panel in a.m. -IVF Hydration now stopped -Continue to monitor for signs and symptoms of bleeding; currently no overt bleeding noted -Repeat CBC in a.m.  Metabolic Acidosis, improving slowly -Mild -Patient's CO2 was now 19, anion gap was 15, chloride level was 105; Now CO2 is 20, AG is 15, and Chloride Level is 105 -Continue to monitor and trend repeat CMP in a.m.  Hyperglycemia -Likely reactive but will need to rule out Diabetes -Check HbA1c in the AM  -Continue to Monitor Blood Sugars carefully and if necessary will place on Sensitive Novolog SSI AC -Patient's Blood Sugar has been ranging from 109-120 on Daily BMP/CMP  Hx of CLL -She was seen by hematology in 05/26/2019 and had minimal progression of any. She was diagnosed at that time stage 0 CLL with a WBC of 24,000, hemoglobin 11.7, and  a platelet count of 468 -Today her WBC is 35.8, hemoglobin/hematocrit 11.3/30.7, and platelet count is 416 -We will obtain an anemia panel  -Will discuss with Oncology about further evaluation and recommendations -Given her labs today do not feel that this is progression of her CLL as her lymphocyte count has actually trended down  Thrombocytosis -Mild -Patient's Platelet Count went from 367 -> 355 -> 416 -Continue to Monitor and Trend -Repeat CBC in the AM   GOC: DNR, poA  DVT prophylaxis: Enoxaparin 40 mg sq q24h Code Status: DO NOT RESUSCITATE Family Communication: No family present at bedside but called Daughter and spoke to her over the phone. Disposition Plan: Pending  further evaluation by PT and OT  Status is: Inpatient  Remains inpatient appropriate because:Unsafe d/c plan, IV treatments appropriate due to intensity of illness or inability to take PO and Inpatient level of care appropriate due to severity of illness   Dispo: The patient is from: Home              Anticipated d/c is to: Home (Family does not want SNF)              Anticipated d/c date is: 2 days              Patient currently is not medically stable to d/c.  Consultants:   Discussed with Oncology Dr. Irene Limbo  Discussed with ID Dr. Tommy Medal  Procedures:  ECHOCardiogram IMPRESSIONS    1. Left ventricular ejection fraction, by estimation, is 55 to 60%. The  left ventricle has normal function. The left ventricle has no regional  wall motion abnormalities. Left ventricular diastolic parameters are  consistent with Grade I diastolic  dysfunction (impaired relaxation). Elevated left atrial pressure.  2. Right ventricular systolic function is normal. The right ventricular  size is normal.  3. The mitral valve is normal in structure. Trivial mitral valve  regurgitation. No evidence of mitral stenosis.  4. The aortic valve is tricuspid. Aortic valve regurgitation is trivial.  Mild aortic valve sclerosis is present, with no evidence of aortic valve  stenosis.  5. The inferior vena cava is normal in size with greater than 50%  respiratory variability, suggesting right atrial pressure of 3 mmHg.   FINDINGS  Left Ventricle: Left ventricular ejection fraction, by estimation, is 55  to 60%. The left ventricle has normal function. The left ventricle has no  regional wall motion abnormalities. The left ventricular internal cavity  size was normal in size. There is  no left ventricular hypertrophy. Left ventricular diastolic parameters  are consistent with Grade I diastolic dysfunction (impaired relaxation).  Elevated left atrial pressure.   Right Ventricle: The right ventricular  size is normal. Right ventricular  systolic function is normal.   Left Atrium: Left atrial size was normal in size.   Right Atrium: Right atrial size was normal in size.   Pericardium: There is no evidence of pericardial effusion.   Mitral Valve: The mitral valve is normal in structure. Mild mitral annular  calcification. Trivial mitral valve regurgitation. No evidence of mitral  valve stenosis.   Tricuspid Valve: The tricuspid valve is normal in structure. Tricuspid  valve regurgitation is trivial. No evidence of tricuspid stenosis.   Aortic Valve: The aortic valve is tricuspid. Aortic valve regurgitation is  trivial. Aortic regurgitation PHT measures 161 msec. Mild aortic valve  sclerosis is present, with no evidence of aortic valve stenosis.   Pulmonic Valve: The pulmonic valve was normal in structure.  Pulmonic valve  regurgitation is not visualized. No evidence of pulmonic stenosis.   Aorta: The aortic root is normal in size and structure.   Venous: The inferior vena cava is normal in size with greater than 50%  respiratory variability, suggesting right atrial pressure of 3 mmHg.    LEFT VENTRICLE  PLAX 2D  LVIDd:     4.20 cm   Diastology  LVIDs:     3.10 cm   LV e' medial:  4.35 cm/s  LV PW:     0.90 cm   LV E/e' medial: 21.4  LV IVS:    1.10 cm   LV e' lateral:  6.92 cm/s  LVOT diam:   2.10 cm   LV E/e' lateral: 13.5  LV SV:     74  LV SV Index:  49  LVOT Area:   3.46 cm    LV Volumes (MOD)  LV vol d, MOD A4C: 77.4 ml  LV vol s, MOD A4C: 32.5 ml  LV SV MOD A4C:   77.4 ml   RIGHT VENTRICLE  RV S prime:   14.50 cm/s  TAPSE (M-mode): 1.9 cm   LEFT ATRIUM       Index    RIGHT ATRIUM     Index  LA diam:    5.00 cm 3.31 cm/m RA Area:   9.20 cm  LA Vol (A2C):  42.4 ml 28.06 ml/m RA Volume:  16.10 ml 10.66 ml/m  LA Vol (A4C):  42.9 ml 28.39 ml/m  LA Biplane Vol: 42.8 ml 28.33 ml/m  AORTIC  VALVE  LVOT Vmax:  102.00 cm/s  LVOT Vmean: 67.500 cm/s  LVOT VTI:  0.213 m  AI PHT:   161 msec    AORTA  Ao Root diam: 3.60 cm  Ao Asc diam: 3.30 cm   MITRAL VALVE  MV Area (PHT): 4.36 cm   SHUNTS  MV Decel Time: 174 msec   Systemic VTI: 0.21 m  MV E velocity: 93.10 cm/s  Systemic Diam: 2.10 cm  MV A velocity: 114.00 cm/s  MV E/A ratio: 0.82   Antimicrobials:  Anti-infectives (From admission, onward)   Start     Dose/Rate Route Frequency Ordered Stop   05/30/20 2230  cefTRIAXone (ROCEPHIN) 1 g in sodium chloride 0.9 % 100 mL IVPB  Status:  Discontinued        1 g 200 mL/hr over 30 Minutes Intravenous Every 24 hours 05/30/20 2010 05/30/20 2017   05/30/20 2230  doxycycline (VIBRAMYCIN) 100 mg in sodium chloride 0.9 % 250 mL IVPB  Status:  Discontinued        100 mg 125 mL/hr over 120 Minutes Intravenous Every 12 hours 05/30/20 2013 05/30/20 2017   05/30/20 2130  Ampicillin-Sulbactam (UNASYN) 3 g in sodium chloride 0.9 % 100 mL IVPB        3 g 200 mL/hr over 30 Minutes Intravenous Every 8 hours 05/30/20 2030     05/30/20 1830  Ampicillin-Sulbactam (UNASYN) 3 g in sodium chloride 0.9 % 100 mL IVPB  Status:  Discontinued        3 g 200 mL/hr over 30 Minutes Intravenous Every 8 hours 05/30/20 1732 05/30/20 2010   05/29/20 2215  cefTRIAXone (ROCEPHIN) 1 g in sodium chloride 0.9 % 100 mL IVPB        1 g 200 mL/hr over 30 Minutes Intravenous  Once 05/29/20 2213 05/29/20 2313   05/29/20 2215  azithromycin (ZITHROMAX) 500 mg in sodium chloride 0.9 % 250 mL  IVPB        500 mg 250 mL/hr over 60 Minutes Intravenous  Once 05/29/20 2213 05/29/20 2348       Subjective: Seen and examined at bedside and she is very pleasantly demented and confused.  She had no concerns or complaints and denied any nausea or vomiting.  Repeat blood cultures are still pending.  Chest x-ray this morning showed a rounded density in the right infrahilar region concerning for possible mass or  neoplasm and there is bibasilar atelectasis or edema noted.  CT scanning of the chest was recommended for further evaluation has been ordered.  Because the patient had positive blood cultures will obtain echocardiogram.  She had no other concerns or complaints at this time.  Objective: Vitals:   05/31/20 1711 05/31/20 2107 06/01/20 0533 06/01/20 1155  BP: 132/60 (!) 158/78 (!) 166/80 (!) 145/70  Pulse: (!) 110 (!) 107 (!) 102 (!) 105  Resp: 20 17 20 19   Temp:  98.2 F (36.8 C) 98.3 F (36.8 C) 98.5 F (36.9 C)  TempSrc:  Oral Oral   SpO2:  94% 92% (!) 76%  Weight:      Height:       No intake or output data in the 24 hours ending 06/01/20 1508 Filed Weights   05/29/20 2049  Weight: 49.4 kg   Examination: Physical Exam:  Constitutional: The patient is a frail elderly pleasantly demented and confused Caucasian female in NAD appears mildly uncomfortable hunched over on her left side  Eyes: Lids and conjunctivae normal, sclerae anicteric  ENMT: External Ears, Nose appear normal. Grossly normal hearing.  Neck: Appears normal, supple, no cervical masses, normal ROM, no appreciable thyromegaly: No JVD Respiratory: Diminished to auscultation bilaterally coarse breath sounds, no wheezing, rales, rhonchi or crackles. Normal respiratory effort and patient is not tachypenic. No accessory muscle use.  Unlabored breathing Cardiovascular: RRR, no murmurs / rubs / gallops. S1 and S2 auscultated.  No appreciable lower extremity Abdomen: Soft, non-tender, non-distended. Bowel sounds positive.  GU: Deferred. Musculoskeletal: No clubbing / cyanosis of digits/nails. No joint deformity upper and lower extremities.  Skin: No rashes, lesions, ulcers on limited skin evaluation. No induration; Warm and dry.  Neurologic: CN 2-12 grossly intact with no focal deficits. Romberg sign and cerebellar reflexes not assessed.  Psychiatric: Impaired judgment and insight.  She is awake but she is not fully alert and  oriented x 3. Normal mood and appropriate affect.   Data Reviewed: I have personally reviewed following labs and imaging studies  CBC: Recent Labs  Lab 05/29/20 2110 05/30/20 1918 05/31/20 0200 06/01/20 0852  WBC 23.2* 22.5* 15.3* 35.8*  NEUTROABS 13.8*  --   --  28.7*  HGB 11.8* 11.3* 13.0 11.3*  HCT 36.6 35.2* 39.0 33.7*  MCV 87.1 85.2 84.8 83.4  PLT 367 366 355 982*   Basic Metabolic Panel: Recent Labs  Lab 05/29/20 2110 05/30/20 1918 05/31/20 0200 06/01/20 0852  NA 133*  --  139 140  K 3.6  --  3.5 3.5  CL 97*  --  105 105  CO2 25  --  19* 20*  GLUCOSE 120*  --  109* 127*  BUN 17  --  10 11  CREATININE 0.86 0.73 0.76 0.71  CALCIUM 8.7*  --  9.0 8.5*  MG  --   --   --  1.9  PHOS  --   --   --  2.9   GFR: Estimated Creatinine Clearance: 36.4 mL/min (by C-G  formula based on SCr of 0.71 mg/dL). Liver Function Tests: Recent Labs  Lab 05/29/20 2110 06/01/20 0852  AST 82* 36  ALT 46* 43  ALKPHOS 109 82  BILITOT 0.2* 0.6  PROT 7.6 6.2*  ALBUMIN 3.0* 2.5*   No results for input(s): LIPASE, AMYLASE in the last 168 hours. No results for input(s): AMMONIA in the last 168 hours. Coagulation Profile: Recent Labs  Lab 05/29/20 2110  INR 1.0   Cardiac Enzymes: No results for input(s): CKTOTAL, CKMB, CKMBINDEX, TROPONINI in the last 168 hours. BNP (last 3 results) No results for input(s): PROBNP in the last 8760 hours. HbA1C: No results for input(s): HGBA1C in the last 72 hours. CBG: No results for input(s): GLUCAP in the last 168 hours. Lipid Profile: No results for input(s): CHOL, HDL, LDLCALC, TRIG, CHOLHDL, LDLDIRECT in the last 72 hours. Thyroid Function Tests: No results for input(s): TSH, T4TOTAL, FREET4, T3FREE, THYROIDAB in the last 72 hours. Anemia Panel: No results for input(s): VITAMINB12, FOLATE, FERRITIN, TIBC, IRON, RETICCTPCT in the last 72 hours. Sepsis Labs: No results for input(s): PROCALCITON, LATICACIDVEN in the last 168  hours.  Recent Results (from the past 240 hour(s))  Resp Panel by RT-PCR (Flu A&B, Covid) Nasopharyngeal Swab     Status: None   Collection Time: 05/29/20  9:10 PM   Specimen: Nasopharyngeal Swab; Nasopharyngeal(NP) swabs in vial transport medium  Result Value Ref Range Status   SARS Coronavirus 2 by RT PCR NEGATIVE NEGATIVE Final    Comment: (NOTE) SARS-CoV-2 target nucleic acids are NOT DETECTED.  The SARS-CoV-2 RNA is generally detectable in upper respiratory specimens during the acute phase of infection. The lowest concentration of SARS-CoV-2 viral copies this assay can detect is 138 copies/mL. A negative result does not preclude SARS-Cov-2 infection and should not be used as the sole basis for treatment or other patient management decisions. A negative result may occur with  improper specimen collection/handling, submission of specimen other than nasopharyngeal swab, presence of viral mutation(s) within the areas targeted by this assay, and inadequate number of viral copies(<138 copies/mL). A negative result must be combined with clinical observations, patient history, and epidemiological information. The expected result is Negative.  Fact Sheet for Patients:  EntrepreneurPulse.com.au  Fact Sheet for Healthcare Providers:  IncredibleEmployment.be  This test is no t yet approved or cleared by the [place] FDA and  has been authorized for detection and/or diagnosis of SARS-CoV-2 by FDA under an Emergency Use Authorization (EUA). This EUA will remain  in effect (meaning this test can be used) for the duration of the COVID-19 declaration under Section 564(b)(1) of the Act, 21 U.S.C.section 360bbb-3(b)(1), unless the authorization is terminated  or revoked sooner.       Influenza A by PCR NEGATIVE NEGATIVE Final   Influenza B by PCR NEGATIVE NEGATIVE Final    Comment: (NOTE) The Xpert Xpress SARS-CoV-2/FLU/RSV plus assay is intended  as an aid in the diagnosis of influenza from Nasopharyngeal swab specimens and should not be used as a sole basis for treatment. Nasal washings and aspirates are unacceptable for Xpert Xpress SARS-CoV-2/FLU/RSV testing.  Fact Sheet for Patients: EntrepreneurPulse.com.au  Fact Sheet for Healthcare Providers: IncredibleEmployment.be  This test is not yet approved or cleared by the [place] FDA and has been authorized for detection and/or diagnosis of SARS-CoV-2 by FDA under an Emergency Use Authorization (EUA). This EUA will remain in effect (meaning this test can be used) for the duration of the COVID-19 declaration under Section 564(b)(1) of  the Act, 21 U.S.C. section 360bbb-3(b)(1), unless the authorization is terminated or revoked.  Performed at [facility], [address] [place], [place] [address]   Culture, blood (routine x 2)     Status: Abnormal (Preliminary result)   Collection Time: 05/29/20 10:10 PM   Specimen: BLOOD  Result Value Ref Range Status   Specimen Description   Final    BLOOD RIGHT ANTECUBITAL Performed at [facility], [place] [address] [place], [place] [address]    Special Requests   Final    BOTTLES DRAWN AEROBIC AND ANAEROBIC Blood Culture adequate volume Performed at [facility], [place] [place], [place] [address]    Culture  Setup Time   Final    GRAM POSITIVE COCCI AEROBIC BOTTLE ONLY CRITICAL RESULT CALLED TO, READ BACK BY AND VERIFIED WITH: Ferne Coe PharmD 10:10 05/31/20 ([email or web address])    Culture (A)  Final    STAPHYLOCOCCUS HOMINIS THE SIGNIFICANCE OF ISOLATING THIS ORGANISM FROM A SINGLE SET OF BLOOD CULTURES WHEN MULTIPLE SETS ARE DRAWN IS UNCERTAIN. PLEASE NOTIFY THE MICROBIOLOGY DEPARTMENT WITHIN ONE WEEK IF SPECIATION AND SENSITIVITIES ARE REQUIRED. Performed at [facility], [place] [address], [place], [place] [address]    Report Status PENDING  Incomplete   Blood Culture ID Panel (Reflexed)     Status: Abnormal   Collection Time: 05/29/20 10:10 PM  Result Value Ref Range Status   Enterococcus faecalis NOT DETECTED NOT DETECTED Final   Enterococcus Faecium NOT DETECTED NOT DETECTED Final   Listeria monocytogenes NOT DETECTED NOT DETECTED Final   Staphylococcus species DETECTED (A) NOT DETECTED Final    Comment: CRITICAL RESULT CALLED TO, READ BACK BY AND VERIFIED WITH: Ferne Coe PharmD 10:10 05/31/20 ([email or web address])    Staphylococcus aureus (BCID) NOT DETECTED NOT DETECTED Final   Staphylococcus epidermidis NOT DETECTED NOT DETECTED Final   Staphylococcus lugdunensis NOT DETECTED NOT DETECTED Final   Streptococcus species NOT DETECTED NOT DETECTED Final   Streptococcus agalactiae NOT DETECTED NOT DETECTED Final   Streptococcus pneumoniae NOT DETECTED NOT DETECTED Final   Streptococcus pyogenes NOT DETECTED NOT DETECTED Final   A.calcoaceticus-baumannii NOT DETECTED NOT DETECTED Final   Bacteroides fragilis NOT DETECTED NOT DETECTED Final   Enterobacterales NOT DETECTED NOT DETECTED Final   Enterobacter cloacae complex NOT DETECTED NOT DETECTED Final   Escherichia coli NOT DETECTED NOT DETECTED Final   Klebsiella aerogenes NOT DETECTED NOT DETECTED Final   Klebsiella oxytoca NOT DETECTED NOT DETECTED Final   Klebsiella pneumoniae NOT DETECTED NOT DETECTED Final   Proteus species NOT DETECTED NOT DETECTED Final   Salmonella species NOT DETECTED NOT DETECTED Final   Serratia marcescens NOT DETECTED NOT DETECTED Final   Haemophilus influenzae NOT DETECTED NOT DETECTED Final   Neisseria meningitidis NOT DETECTED NOT DETECTED Final   Pseudomonas aeruginosa NOT DETECTED NOT DETECTED Final   Stenotrophomonas maltophilia NOT DETECTED NOT DETECTED Final   Candida albicans NOT DETECTED NOT DETECTED Final   Candida auris NOT DETECTED NOT DETECTED Final   Candida glabrata NOT DETECTED NOT DETECTED Final   Candida krusei NOT DETECTED NOT DETECTED Final    Candida parapsilosis NOT DETECTED NOT DETECTED Final   Candida tropicalis NOT DETECTED NOT DETECTED Final   Cryptococcus neoformans/gattii NOT DETECTED NOT DETECTED Final    Comment: Performed at [facility], [address], [place], [place] [address]  Culture, blood (routine x 2)     Status: Abnormal (Preliminary result)  Collection Time: 05/29/20 10:13 PM   Specimen: BLOOD RIGHT WRIST  Result Value Ref Range Status   Specimen Description   Final    BLOOD RIGHT WRIST Performed at [facility], [place] [place], [place] [address]    Special Requests   Final    BOTTLES DRAWN AEROBIC AND ANAEROBIC Blood Culture results may not be optimal due to an inadequate volume of blood received in culture bottles Performed at [facility], [place] [place], [place] [address]    Culture  Setup Time   Final    GRAM POSITIVE COCCI IN CHAINS IN BOTH AEROBIC AND ANAEROBIC BOTTLES Organism ID to follow CRITICAL RESULT CALLED TO, READ BACK BY AND VERIFIED WITH: Ferne Coe PharmD 10:10 05/31/20 ([email or web address])    Culture (A)  Final    STREPTOCOCCUS MUTANS STREPTOCOCCUS GORDONII THE SIGNIFICANCE OF ISOLATING THIS ORGANISM FROM A SINGLE SET OF BLOOD CULTURES WHEN MULTIPLE SETS ARE DRAWN IS UNCERTAIN. PLEASE NOTIFY THE MICROBIOLOGY DEPARTMENT WITHIN ONE WEEK IF SPECIATION AND SENSITIVITIES ARE REQUIRED. Performed at [facility], [place] [address] [place], [place] [address]    Report Status PENDING  Incomplete  Blood Culture ID Panel (Reflexed)     Status: Abnormal   Collection Time: 05/29/20 10:13 PM  Result Value Ref Range Status   Enterococcus faecalis NOT DETECTED NOT DETECTED Final   Enterococcus Faecium NOT DETECTED NOT DETECTED Final   Listeria monocytogenes NOT DETECTED NOT DETECTED Final   Staphylococcus species NOT DETECTED NOT DETECTED Final   Staphylococcus aureus (BCID) NOT DETECTED NOT DETECTED Final   Staphylococcus epidermidis NOT DETECTED NOT  DETECTED Final   Staphylococcus lugdunensis NOT DETECTED NOT DETECTED Final   Streptococcus species DETECTED (A) NOT DETECTED Final    Comment: Not Enterococcus species, Streptococcus agalactiae, Streptococcus pyogenes, or Streptococcus pneumoniae. CRITICAL RESULT CALLED TO, READ BACK BY AND VERIFIED WITH: Ferne Coe PharmD 10:10 05/31/20 ([email or web address])    Streptococcus agalactiae NOT DETECTED NOT DETECTED Final   Streptococcus pneumoniae NOT DETECTED NOT DETECTED Final   Streptococcus pyogenes NOT DETECTED NOT DETECTED Final   A.calcoaceticus-baumannii NOT DETECTED NOT DETECTED Final   Bacteroides fragilis NOT DETECTED NOT DETECTED Final   Enterobacterales NOT DETECTED NOT DETECTED Final   Enterobacter cloacae complex NOT DETECTED NOT DETECTED Final   Escherichia coli NOT DETECTED NOT DETECTED Final   Klebsiella aerogenes NOT DETECTED NOT DETECTED Final   Klebsiella oxytoca NOT DETECTED NOT DETECTED Final   Klebsiella pneumoniae NOT DETECTED NOT DETECTED Final   Proteus species NOT DETECTED NOT DETECTED Final   Salmonella species NOT DETECTED NOT DETECTED Final   Serratia marcescens NOT DETECTED NOT DETECTED Final   Haemophilus influenzae NOT DETECTED NOT DETECTED Final   Neisseria meningitidis NOT DETECTED NOT DETECTED Final   Pseudomonas aeruginosa NOT DETECTED NOT DETECTED Final   Stenotrophomonas maltophilia NOT DETECTED NOT DETECTED Final   Candida albicans NOT DETECTED NOT DETECTED Final   Candida auris NOT DETECTED NOT DETECTED Final   Candida glabrata NOT DETECTED NOT DETECTED Final   Candida krusei NOT DETECTED NOT DETECTED Final   Candida parapsilosis NOT DETECTED NOT DETECTED Final   Candida tropicalis NOT DETECTED NOT DETECTED Final   Cryptococcus neoformans/gattii NOT DETECTED NOT DETECTED Final    Comment: Performed at [facility], [address] [place], [place] [address]     RN Pressure Injury Documentation:     Estimated body mass index is 18.69 kg/m as  calculated from the  following:   Height as of this encounter: 5' 4"  (1.626 m).   Weight as of this encounter: 49.4 kg.  Malnutrition Type:    Malnutrition Characteristics:   Nutrition Interventions:     Radiology Studies: DG CHEST PORT 1 VIEW  Result Date: 06/01/2020 CLINICAL DATA:  Dyspnea. EXAM: PORTABLE CHEST 1 VIEW COMPARISON:  May 29, 2020. FINDINGS: Stable cardiomediastinal silhouette. No pneumothorax is noted. Rounded density is seen in right infrahilar region concerning for possible mass or neoplasm. Bibasilar atelectasis or edema is noted with possible small right pleural effusion. Bony thorax is unremarkable. IMPRESSION: Rounded density seen in right infrahilar region concerning for possible mass or neoplasm. Bibasilar atelectasis or edema is noted with possible small right pleural effusion. CT scan of the chest is recommended for further evaluation. Electronically Signed   By: Marijo Conception M.D.   On: 06/01/2020 09:38   DG Swallowing Func-Speech Pathology  Result Date: 05/31/2020 Objective Swallowing Evaluation: Type of Study: MBS-Modified Barium Swallow Study  Patient Details Name: Deashia Soule MRN: [id number] Date of Birth: 1928/03/28 Today's Date: 05/31/2020 Time: SLP Start Time (ACUTE ONLY): 1258 -SLP Stop Time (ACUTE ONLY): 1314 SLP Time Calculation (min) (ACUTE ONLY): 16 min Past Medical History: Past Medical History: Diagnosis Date . Breast cancer ([facility] [place])  . Chronic low back pain  . CLL (chronic lymphocytic leukemia) ([place])  . Dehydration  . Hypertension  . Memory loss  . Sacral fracture ([place]) 03/2019 Past Surgical History: Past Surgical History: Procedure Laterality Date . MASTECTOMY   HPI: Pt is a 86 y.o. female with medical history significant of advanced dementia, hypertension, breast cancer and lymphoma survivor who was brought to [facility] with complaint of some cough and upper respiratory symptoms more prominent with eating and drinking for the past week prior to  admission. CXR 12/13: Right basilar consolidation possibly reflecting changes of acute lobar pneumonia in the appropriate clinical setting.  No data recorded Assessment / Plan / Recommendation CHL IP CLINICAL IMPRESSIONS 05/31/2020 Clinical Impression Pt was seen in radiology suite for modified barium swallow study. Trials of puree solids, regular texture solids, and thin liquids via cup and straw were administered. She demonstrated oral phase dysphagia characterized by reduced posterior bolus propulsion and lingual rocking. Coughing was once noted during the study; however, the pharyngeal phase of her swallow was within normal limits and no instances of penetration or aspiration were noted. It is recommended that a dysphagia 2 diet with thin liquids be initiated at this time. SLP will see patient once more to ensure tolerance of the diet; however, further skilled SLP services will likely not be needed beyond that. SLP Visit Diagnosis Dysphagia, unspecified (R13.10) Attention and concentration deficit following -- Frontal lobe and executive function deficit following -- Impact on safety and function Mild aspiration risk   CHL IP TREATMENT RECOMMENDATION 05/31/2020 Treatment Recommendations Therapy as outlined in treatment plan below   Prognosis 05/31/2020 Prognosis for Safe Diet Advancement Fair Barriers to Reach Goals Cognitive deficits Barriers/Prognosis Comment -- CHL IP DIET RECOMMENDATION 05/31/2020 SLP Diet Recommendations Dysphagia 2 (Fine chop) solids;Thin liquid Liquid Administration via Straw;Cup Medication Administration Crushed with puree Compensations Slow rate;Minimize environmental distractions Postural Changes Remain semi-upright after after feeds/meals (Comment)   CHL IP OTHER RECOMMENDATIONS 05/31/2020 Recommended Consults -- Oral Care Recommendations Oral care BID Other Recommendations --   CHL IP FOLLOW UP RECOMMENDATIONS 05/31/2020 Follow up Recommendations None   CHL IP FREQUENCY AND DURATION  05/31/2020 Speech Therapy Frequency (ACUTE ONLY) min 2x/week Treatment Duration 1 week  CHL IP ORAL PHASE 05/31/2020 Oral Phase Impaired Oral - Pudding Teaspoon -- Oral - Pudding Cup -- Oral - Honey Teaspoon -- Oral - Honey Cup -- Oral - Nectar Teaspoon -- Oral - Nectar Cup -- Oral - Nectar Straw -- Oral - Thin Teaspoon -- Oral - Thin Cup Lingual pumping;Reduced posterior propulsion Oral - Thin Straw Lingual pumping;Reduced posterior propulsion Oral - Puree Lingual pumping;Reduced posterior propulsion Oral - Mech Soft -- Oral - Regular Lingual pumping;Reduced posterior propulsion Oral - Multi-Consistency -- Oral - Pill -- Oral Phase - Comment --  CHL IP PHARYNGEAL PHASE 05/31/2020 Pharyngeal Phase WFL Pharyngeal- Pudding Teaspoon -- Pharyngeal -- Pharyngeal- Pudding Cup -- Pharyngeal -- Pharyngeal- Honey Teaspoon -- Pharyngeal -- Pharyngeal- Honey Cup -- Pharyngeal -- Pharyngeal- Nectar Teaspoon -- Pharyngeal -- Pharyngeal- Nectar Cup -- Pharyngeal -- Pharyngeal- Nectar Straw -- Pharyngeal -- Pharyngeal- Thin Teaspoon -- Pharyngeal -- Pharyngeal- Thin Cup WFL Pharyngeal -- Pharyngeal- Thin Straw WFL Pharyngeal -- Pharyngeal- Puree WFL Pharyngeal -- Pharyngeal- Mechanical Soft WFL Pharyngeal -- Pharyngeal- Regular WFL Pharyngeal -- Pharyngeal- Multi-consistency NT Pharyngeal -- Pharyngeal- Pill NT Pharyngeal -- Pharyngeal Comment --  CHL IP CERVICAL ESOPHAGEAL PHASE 05/31/2020 Cervical Esophageal Phase WFL Pudding Teaspoon -- Pudding Cup -- Honey Teaspoon -- Honey Cup -- Nectar Teaspoon -- Nectar Cup -- Nectar Straw -- Thin Teaspoon -- Thin Cup -- Thin Straw -- Puree -- Mechanical Soft -- Regular -- Multi-consistency -- Pill -- Cervical Esophageal Comment -- Shanika I. Hardin Negus, [place], [place] Office number [phone] Pager [phone] Horton Marshall 05/31/2020, 1:35 PM              ECHOCARDIOGRAM COMPLETE  Result Date: 06/01/2020    ECHOCARDIOGRAM REPORT   Patient Name:    DARIEL PELLECCHIA Date of Exam: 06/01/2020 Medical Rec #:  [id number]      Height:       64.0 in Accession #:    [id number]     Weight:       108.9 lb Date of Birth:  May 07, 1928      BSA:          1.511 m Patient Age:    46 years       BP:           166/80 mmHg Patient Gender: F              HR:           103 bpm. Exam Location:  Inpatient Procedure: 2D Echo, Color Doppler and Cardiac Doppler Indications:    Bacteremia R78.81  History:        Patient has no prior history of Echocardiogram examinations.                 Risk Factors:Hypertension.  Sonographer:    Bernadene Person RDCS Referring Phys: [id number] Georgina Quint LATIF Zena  1. Left ventricular ejection fraction, by estimation, is 55 to 60%. The left ventricle has normal function. The left ventricle has no regional wall motion abnormalities. Left ventricular diastolic parameters are consistent with Grade I diastolic dysfunction (impaired relaxation). Elevated left atrial pressure.  2. Right ventricular systolic function is normal. The right ventricular size is normal.  3. The mitral valve is normal in structure. Trivial mitral valve regurgitation. No evidence of mitral stenosis.  4. The aortic valve is tricuspid. Aortic valve regurgitation is trivial. Mild aortic valve sclerosis is present, with no evidence of aortic valve stenosis.  5. The inferior vena cava is normal in size  with greater than 50% respiratory variability, suggesting right atrial pressure of 3 mmHg. FINDINGS  Left Ventricle: Left ventricular ejection fraction, by estimation, is 55 to 60%. The left ventricle has normal function. The left ventricle has no regional wall motion abnormalities. The left ventricular internal cavity size was normal in size. There is  no left ventricular hypertrophy. Left ventricular diastolic parameters are consistent with Grade I diastolic dysfunction (impaired relaxation). Elevated left atrial pressure. Right Ventricle: The right ventricular size is normal. Right  ventricular systolic function is normal. Left Atrium: Left atrial size was normal in size. Right Atrium: Right atrial size was normal in size. Pericardium: There is no evidence of pericardial effusion. Mitral Valve: The mitral valve is normal in structure. Mild mitral annular calcification. Trivial mitral valve regurgitation. No evidence of mitral valve stenosis. Tricuspid Valve: The tricuspid valve is normal in structure. Tricuspid valve regurgitation is trivial. No evidence of tricuspid stenosis. Aortic Valve: The aortic valve is tricuspid. Aortic valve regurgitation is trivial. Aortic regurgitation PHT measures 161 msec. Mild aortic valve sclerosis is present, with no evidence of aortic valve stenosis. Pulmonic Valve: The pulmonic valve was normal in structure. Pulmonic valve regurgitation is not visualized. No evidence of pulmonic stenosis. Aorta: The aortic root is normal in size and structure. Venous: The inferior vena cava is normal in size with greater than 50% respiratory variability, suggesting right atrial pressure of 3 mmHg.  LEFT VENTRICLE PLAX 2D LVIDd:         4.20 cm     Diastology LVIDs:         3.10 cm     LV e' medial:    4.35 cm/s LV PW:         0.90 cm     LV E/e' medial:  21.4 LV IVS:        1.10 cm     LV e' lateral:   6.92 cm/s LVOT diam:     2.10 cm     LV E/e' lateral: 13.5 LV SV:         74 LV SV Index:   49 LVOT Area:     3.46 cm  LV Volumes (MOD) LV vol d, MOD A4C: 77.4 ml LV vol s, MOD A4C: 32.5 ml LV SV MOD A4C:     77.4 ml RIGHT VENTRICLE RV S prime:     14.50 cm/s TAPSE (M-mode): 1.9 cm LEFT ATRIUM             Index       RIGHT ATRIUM          Index LA diam:        5.00 cm 3.31 cm/m  RA Area:     9.20 cm LA Vol (A2C):   42.4 ml 28.06 ml/m RA Volume:   16.10 ml 10.66 ml/m LA Vol (A4C):   42.9 ml 28.39 ml/m LA Biplane Vol: 42.8 ml 28.33 ml/m  AORTIC VALVE LVOT Vmax:   102.00 cm/s LVOT Vmean:  67.500 cm/s LVOT VTI:    0.213 m AI PHT:      161 msec  AORTA Ao Root diam: 3.60 cm Ao  Asc diam:  3.30 cm MITRAL VALVE MV Area (PHT): 4.36 cm     SHUNTS MV Decel Time: 174 msec     Systemic VTI:  0.21 m MV E velocity: 93.10 cm/s   Systemic Diam: 2.10 cm MV A velocity: 114.00 cm/s MV E/A ratio:  0.82 Kirk Ruths MD Electronically signed by Kirk Ruths  MD Signature Date/Time: 06/01/2020/2:33:00 PM    Final    Scheduled Meds: . donepezil  5 mg Oral QHS  . enoxaparin (LOVENOX) injection  40 mg Subcutaneous Q24H   Continuous Infusions: . ampicillin-sulbactam (UNASYN) IV 3 g (06/01/20 0530)    LOS: 2 days   Kerney Elbe, DO [facility] PAGER is on [place]  If 7PM-7AM, please contact night-coverage [email or web address]

## 2020-06-01 NOTE — Plan of Care (Signed)

## 2020-06-01 NOTE — Evaluation (Signed)
Occupational Therapy Evaluation Patient Details Name: Joan Hill MRN: [id number] DOB: June 26, 1927 Today's Date: 06/01/2020    History of Present Illness 86 y.o. female with medical history significant of advanced dementia, hypertension, breast cancer and lymphoma survivor was brought [facility]  with complaint of some cough and upper respiratory symptoms more prominent with eating and drinking for the past 1 week. Pt found to have aspiration PNA.   Clinical Impression   PTA, pt was living with her daughter and requires assistance for BADLs; information per daughter at PT eval. Pt currently requiring Max A for ADLs and functional transfers. Pt presenting with decreased cognition, balance, strength, and activity tolerance. Pt also with increased fear of falling. Pt would benefit from further acute OT to facilitate safe dc. Recommend dc to SNF for further OT to optimize safety, independence with ADLs, and return to PLOF.      Follow Up Recommendations  SNF;Supervision/Assistance - 24 hour (If family want pt to dc to home, will benefit from [facility])    Equipment Recommendations  3 in 1 bedside commode    Recommendations for Other Services PT consult     Precautions / Restrictions Precautions Precautions: Fall      Mobility Bed Mobility Overal bed mobility: Needs Assistance Bed Mobility: Supine to Sit;Sit to Supine     Supine to sit: HOB elevated;Mod assist Sit to supine: Mod assist   General bed mobility comments: Mod A for elevating trunk and then managing BLEs in return to supine    Transfers Overall transfer level: Needs assistance Equipment used: Rolling walker (2 wheeled) Transfers: Sit to/from [facility] Sit to Stand: Max assist Stand pivot transfers: Max assist       General transfer comment: Max A for power up and to maintain balance ot BSC    Balance Overall balance assessment: Needs assistance Sitting-balance support: Single extremity  supported;Feet unsupported;No upper extremity supported Sitting balance-Leahy Scale: Fair     Standing balance support: During functional activity;Bilateral upper extremity supported Standing balance-Leahy Scale: Zero                             ADL either performed or assessed with clinical judgement   ADL Overall ADL's : Needs assistance/impaired     Grooming: Min guard;Sitting Grooming Details (indicate cue type and reason): Pt brushing her hair while seated at EOB (use of mirror). Upper Body Bathing: Maximal assistance;Sitting   Lower Body Bathing: Maximal assistance;Sit to/from stand   Upper Body Dressing : Maximal assistance;Sitting   Lower Body Dressing: Maximal assistance;Sit to/from stand   Toilet Transfer: Maximal assistance;Stand-pivot;BSC   Toileting- Clothing Manipulation and Hygiene: Maximal assistance;+2 for physical assistance       Functional mobility during ADLs: Maximal assistance (stand pivot) General ADL Comments: Max A for ADLs due to cognitive deficits and poor balance     Vision         Perception     Praxis      Pertinent Vitals/Pain Pain Assessment: Faces Faces Pain Scale: No hurt Pain Intervention(s): Monitored during session     Hand Dominance     Extremity/Trunk Assessment Upper Extremity Assessment Upper Extremity Assessment: Generalized weakness   Lower Extremity Assessment Lower Extremity Assessment: Generalized weakness   Cervical / Trunk Assessment Cervical / Trunk Assessment: Other exceptions;Kyphotic (Scoliosis)   Communication Communication Communication: No difficulties   Cognition Arousal/Alertness: Awake/alert Behavior During Therapy: WFL for tasks assessed/performed Overall Cognitive Status: History of  cognitive impairments - at baseline                                 General Comments: pt with dementia, oriented to person, does still recall family per daughter but does believe her  mother is still living. Poor awareness of deficits and safety awareness. No recall of situation or place.   General Comments  VSS    Exercises     Shoulder Instructions      Home Living Family/patient expects to be discharged to:: Private residence Living Arrangements: Children Available Help at Discharge: Family;Available PRN/intermittently Type of Home: House Home Access: Ramped entrance                         Additional Comments: Information from PT evaluation      Prior Functioning/Environment Level of Independence: Needs assistance  Gait / Transfers Assistance Needed: Dtr Earnest Bailey reports the pt often requires assistance for transfers as well as assistance to ambulate short household distances with use of a RW. Some days the family assists the pt in stand pivot transfers and wheelchair mobility. ADL's / Homemaking Assistance Needed: assistance for all ADLs   Comments: Information from PT evaluation        OT Problem List: Decreased range of motion;Decreased strength;Decreased activity tolerance;Impaired balance (sitting and/or standing);Decreased knowledge of use of DME or AE;Decreased safety awareness;Decreased knowledge of precautions;Decreased cognition      OT Treatment/Interventions: Self-care/ADL training;Therapeutic exercise;DME and/or AE instruction;Energy conservation;Therapeutic activities;Patient/family education    OT Goals(Current goals can be found in the care plan section) Acute Rehab OT Goals Patient Stated Goal: Family goal to discharge home with increased caregiver assistance OT Goal Formulation: With patient Time For Goal Achievement: 06/15/20 Potential to Achieve Goals: Good  OT Frequency: Min 2X/week   Barriers to D/C:            Co-evaluation              AM-PAC OT "6 Clicks" Daily Activity     Outcome Measure Help from another person eating meals?: A Lot Help from another person taking care of personal grooming?: A  Little Help from another person toileting, which includes using toliet, bedpan, or urinal?: A Lot Help from another person bathing (including washing, rinsing, drying)?: A Lot Help from another person to put on and taking off regular upper body clothing?: A Lot Help from another person to put on and taking off regular lower body clothing?: A Lot 6 Click Score: 13   End of Session Nurse Communication: Mobility status;Other (comment) (BM)  Activity Tolerance: Patient tolerated treatment well Patient left: in bed;with call bell/phone within reach;with bed alarm set;with nursing/sitter in room  OT Visit Diagnosis: Unsteadiness on feet (R26.81);Other abnormalities of gait and mobility (R26.89);Muscle weakness (generalized) (M62.81);Pain                Time: 3810-1751 OT Time Calculation (min): 25 min Charges:  OT General Charges $OT Visit: 1 Visit OT Evaluation $OT Eval Moderate Complexity: 1 Mod OT Treatments $Self Care/Home Management : 8-22 mins  Dom Haverland MSOT, OTR/L Acute Rehab Pager: [phone] Office: [place] 06/01/2020, 4:51 PM

## 2020-06-01 NOTE — Progress Notes (Signed)
Dear Doctor:  This patient has been identified as a candidate for PICC for the following reason (s): poor veins/poor circulatory system (CHF, COPD, emphysema, diabetes, steroid use, IV drug abuse, etc.) and restarts due to phlebitis and infiltration in 24 hours If you agree, please write an order for the indicated device. For any questions contact the Vascular Access Team at [phone] if no answer, please leave a message.  Patient has 1 arm restricted, would not recommend a midline per this reason.  Thank you for supporting the early vascular access assessment program.

## 2020-06-01 NOTE — Progress Notes (Signed)
  Echocardiogram 2D Echocardiogram has been performed.  Joan Hill 06/01/2020, 2:09 PM

## 2020-06-02 ENCOUNTER — Other Ambulatory Visit (HOSPITAL_COMMUNITY): Payer: Self-pay | Admitting: Internal Medicine

## 2020-06-02 DIAGNOSIS — J90 Pleural effusion, not elsewhere classified: Secondary | ICD-10-CM

## 2020-06-02 LAB — CULTURE, BLOOD (ROUTINE X 2): Special Requests: ADEQUATE

## 2020-06-02 LAB — COMPREHENSIVE METABOLIC PANEL
ALT: 34 U/L (ref 0–44)
AST: 22 U/L (ref 15–41)
Albumin: 2.5 g/dL — ABNORMAL LOW (ref 3.5–5.0)
Alkaline Phosphatase: 72 U/L (ref 38–126)
Anion gap: 12 (ref 5–15)
BUN: 12 mg/dL (ref 8–23)
CO2: 23 mmol/L (ref 22–32)
Calcium: 8.6 mg/dL — ABNORMAL LOW (ref 8.9–10.3)
Chloride: 104 mmol/L (ref 98–111)
Creatinine, Ser: 0.85 mg/dL (ref 0.44–1.00)
GFR, Estimated: >60 mL/min (ref >60)
Glucose, Bld: 117 mg/dL — ABNORMAL HIGH (ref 70–99)
Potassium: 3 mmol/L — ABNORMAL LOW (ref 3.5–5.1)
Sodium: 139 mmol/L (ref 135–145)
Total Bilirubin: 0.7 mg/dL (ref 0.3–1.2)
Total Protein: 6.3 g/dL — ABNORMAL LOW (ref 6.5–8.1)

## 2020-06-02 LAB — CBC WITH DIFFERENTIAL/PLATELET
Abs Immature Granulocytes: 0 10*3/uL (ref 0.00–0.07)
Basophils Absolute: 0 10*3/uL (ref 0.0–0.1)
Basophils Relative: 0 %
Eosinophils Absolute: 2.8 10*3/uL — ABNORMAL HIGH (ref 0.0–0.5)
Eosinophils Relative: 9 %
HCT: 32.7 % — ABNORMAL LOW (ref 36.0–46.0)
Hemoglobin: 11.1 g/dL — ABNORMAL LOW (ref 12.0–15.0)
Lymphocytes Relative: 8 %
Lymphs Abs: 2.5 10*3/uL (ref 0.7–4.0)
MCH: 28.3 pg (ref 26.0–34.0)
MCHC: 33.9 g/dL (ref 30.0–36.0)
MCV: 83.4 fL (ref 80.0–100.0)
Monocytes Absolute: 0.9 10*3/uL (ref 0.1–1.0)
Monocytes Relative: 3 %
Neutro Abs: 25.1 10*3/uL — ABNORMAL HIGH (ref 1.7–7.7)
Neutrophils Relative %: 80 %
Platelets: 450 10*3/uL — ABNORMAL HIGH (ref 150–400)
RBC: 3.92 MIL/uL (ref 3.87–5.11)
RDW: 15.9 % — ABNORMAL HIGH (ref 11.5–15.5)
WBC: 31.4 10*3/uL — ABNORMAL HIGH (ref 4.0–10.5)
nRBC: 0 % (ref 0.0–0.2)

## 2020-06-02 LAB — MAGNESIUM: Magnesium: 1.7 mg/dL (ref 1.7–2.4)

## 2020-06-02 LAB — PHOSPHORUS: Phosphorus: 2.6 mg/dL (ref 2.5–4.6)

## 2020-06-02 MED ORDER — SODIUM CHLORIDE 3 % IN NEBU
4.0000 mL | INHALATION_SOLUTION | Freq: Every day | RESPIRATORY_TRACT | Status: DC
  Administered 2020-06-02: 4 mL via RESPIRATORY_TRACT
  Filled 2020-06-02: qty 4

## 2020-06-02 MED ORDER — AMOXICILLIN-POT CLAVULANATE 875-125 MG PO TABS
1.0000 | ORAL_TABLET | Freq: Two times a day (BID) | ORAL | Status: DC
  Administered 2020-06-02: 1 via ORAL
  Filled 2020-06-02: qty 1

## 2020-06-02 MED ORDER — MAGNESIUM SULFATE 2 GM/50ML IV SOLN
2.0000 g | Freq: Once | INTRAVENOUS | Status: AC
  Administered 2020-06-02: 2 g via INTRAVENOUS
  Filled 2020-06-02: qty 50

## 2020-06-02 MED ORDER — ACETAMINOPHEN 325 MG PO TABS: 650.0000 mg | ORAL_TABLET | Freq: Four times a day (QID) | ORAL | 0 refills | Status: DC | PRN

## 2020-06-02 MED ORDER — FUROSEMIDE 10 MG/ML IJ SOLN
40.0000 mg | Freq: Once | INTRAMUSCULAR | Status: AC
  Administered 2020-06-02: 40 mg via INTRAVENOUS
  Filled 2020-06-02: qty 4

## 2020-06-02 MED ORDER — AMOXICILLIN-POT CLAVULANATE 875-125 MG PO TABS: 1.0000 | ORAL_TABLET | Freq: Two times a day (BID) | ORAL | 0 refills | Status: DC

## 2020-06-02 MED ORDER — POTASSIUM CHLORIDE CRYS ER 20 MEQ PO TBCR
40.0000 meq | EXTENDED_RELEASE_TABLET | Freq: Two times a day (BID) | ORAL | Status: DC
  Administered 2020-06-02: 40 meq via ORAL
  Filled 2020-06-02: qty 2

## 2020-06-02 MED ORDER — ONDANSETRON HCL 4 MG PO TABS: 4.0000 mg | ORAL_TABLET | Freq: Four times a day (QID) | ORAL | 0 refills | Status: DC | PRN

## 2020-06-02 MED ORDER — POLYETHYLENE GLYCOL 3350 17 G PO PACK: 17.0000 g | PACK | Freq: Every day | ORAL | 0 refills | Status: AC | PRN

## 2020-06-02 MED FILL — ACETAMINOPHEN 325 MG TABS: 325 | 3 days supply | Qty: 30 | Fill #0

## 2020-06-02 MED FILL — POLYETHYLENE GLYCOL 3350 PO: 17 | 30 days supply | Qty: 510 | Fill #0

## 2020-06-02 MED FILL — AMOX-CLAV 875-125 MG TABLET: 875-125 | 6 days supply | Qty: 12 | Fill #0

## 2020-06-02 MED FILL — ONDANSETRON HCL 4 MG TABLET: 4 | 5 days supply | Qty: 20 | Fill #0

## 2020-06-02 NOTE — Care Management Important Message (Signed)
Important Message  Patient Details  Name: Joan Hill MRN: [id number] Date of Birth: 1927/12/26   Medicare Important Message Given:  Yes  Due to staffing patient room called IM explained patient requested a copy mailed to the room.    Laverle Pillard 06/02/2020, 2:50 PM

## 2020-06-02 NOTE — Discharge Summary (Signed)
Physician Discharge Summary  Joan Hill BTD:[id number] DOB: 1927-12-29 DOA: 05/29/2020  PCP: Debbrah Alar, NP  Admit date: 05/29/2020 Discharge date: 06/02/2020  Admitted From: Home Disposition: Home with Home Health   Recommendations for Outpatient Follow-up:  1. Follow up with PCP in 1-2 weeks 2. Have outpatient Palliative Care referral 3. Repeat CXR in 3-6 weeks 4. Please obtain CMP/CBC, Mag, Phos in one week 5. Please follow up on the following pending results:  Home Health: Yes Equipment/Devices: None  Discharge Condition: S CODE STATUS: DO NOT RESUSCITATE  Diet recommendation: Dysphagia 2 Diet with thin liquids   Brief/Interim Summary: HPI per Dr. Lorella Nimrod on 05/30/20 Joan Hill a 86 y.o.femalewith medical history significant ofadvanced dementia, hypertension, breast cancer and lymphoma survivor was brought to [facility] complaint of some cough and upper respiratory symptoms more prominent with eating and drinking for the past 1 week. Patient was unable to provide any history,which was obtained on phone from her daughter who is also her POA and patient lives with her.According to her she was having some cough especially with eating and drinking and increased drooling for the past 1 week. Denies any fever or chills. Denies any sick contacts. Denies any nausea and vomiting. There was some concern of facial drop at urgent care but family never noticed any changes,CT head was done and it was negative for any acute infarct.Per daughter patient is completely dependent for all her ADLs,recently keeping food in her mouth,occasionally spitting out. Daughter is not aware of any urinary symptoms.Poor p.o. intake. Amoxicillin allergies were noted in her chart but daughter is not aware of any allergies. Per daughter she was on ramipril 10 mg daily which was taken off recently by her provider as her blood pressure was low.  ED Course:Hemodynamically  stable, labs pertinent for leukocytosis,chest x-ray with right basilar consolidation concerning for aspiration pneumonia. CT head was negative for any acute infarct.  **Interim History Her white count was initially trending down but then significantly worsened so we obtained an ESR and a CRP and ESR was 78 and CRP was 9.9.  I spoke to ID about the patient's blood cultures and Dr. Tommy Medal feels that they may have been contaminants but repeat blood culture still pending.  He recommended obtaining a CT scan of the chest as well as an echocardiogram for completion this has been done.  CT chest with contrast is pending read and echocardiogram has been done. She has been afebrile. CXR was concerning for mass or neoplasm so will follow up on the CT Scan.   CT scan showed a right lower lobe pneumonia and right middle lobe pneumonia along with pleural effusion. Case was discussed with the patient's family in length and they elected not to pursue aggressive measures and treating the pleural fluid so they declined a thoracentesis. We'll give a dose of IV Lasix. Patient and family want the patient home and wanted an outpatient palliative care referral. Patient is improved and is not in any respiratory distress and stable to be discharged home with family. Patient was transitioned off of IV antibiotics to p.o. antibiotics and will treat for 6 more days to have a 10-day course for her aspiration. Is advised the patient may continue to aspirate and that we can only do so much to prevent it. Given her advanced dementia I feel that she will continue to aspirate but family understood this and they still wanted her home given that her advanced age and prognosis is not the best. Patient is  to be discharged home with outpatient palliative care referral and if patient fails improve they can consider hospice at that time and make the patient comfortable.  Discharge Diagnoses:  Active Problems:   Aspiration pneumonia  (HCC)  Aspiration pneumonia, poA  with now associated pleural effusion bronchiectasis, bacteremia is ruled out as likely was contaminant and repeat blood cultures are showing no growth to date -Patient received Zithromax and ceftriaxone in ED. -Not Septic on Admission -Amoxicillin allergy was listed in her chart but daughter was not aware of any serious allergies.Concern of aspiration with eating and drinking. -CT head negative for any acute infarct.  Will obtain a CT of the chest with contrast -Kept n.p.o. initially but MBS was done and speech therapy now recommending dysphagia 2 diet with thin liquids with medication administration and crushed with pure and liquid administration throughout a strong a cup -C/w Aspiration Precautions -Started her on Unasyn will continue for now but since her WBC has worsened we will escalate to IV Zosyn at the recommendations of infectious diseases -WBC went from 23.2 -> 22.5 -> 15.3 and was improving but significantly worsened now to 35.8 yesterday and is now a little bit better at; 31.4 she remains afebrile -Urinalysis was relatively unremarkable and showed a clear appearance with trace hemoglobin, small leukocytes, negative nitrites, few bacteria, 6-10 RBCs per high-power field, 0-5 squamous epithelial cells and 6-10 WBCs -Normal saline at 75 mL/hr stopped -Blood cultures x2 obtained and showed Staphylococcus and Streptococcal species possibly contaminated but we will continue IV Unasyn but this was escalated to IV Zosyn yesterday; Repeat blood cultures x2 showing no growth to date 2 days -CXR this AM showed "Rounded density seen in right infrahilar region concerning for possible mass or neoplasm. Bibasilar atelectasis or edema is noted with possible small right pleural effusion. CT scan of the chest is recommended for further evaluation." -The case was discussed with infectious diseases Dr. Tommy Medal and he recommended following up on the blood cultures,  obtaining echocardiogram as well as a CT of the chest -Follow-up on repeat blood cultures -The patient's WBC was trending down but now gone up; Will obtain CT and repeat CBC in the AM -? Elevation of WBC in the setting of her CLL but after further review and review of her lymphocyte count this is highly unlikely as her lymphocyte count has improved.  She predominately has a neutrophilia; case was discussed with oncology and feels that she could have a mass on [place] that could cause a paraneoplastic neutrophilia however this will take weeks and this is likely an acute infection. -Follow up on CT Scan and repeat CBC in the AM; -CT scan showed "Moderate right pleural effusion is noted with adjacent atelectasis or pneumonia of the right lower lobe. 2. There is also noted right middle lobe opacity with air bronchograms concerning for pneumonia. Abnormal soft tissue density is noted in the right middle lobe bronchus consistent with mucous plugging or aspirated material. Some degree of bronchiectasis is noted in the right lower lobe. 3. Enlarged mediastinal and bilateral hilar lymph nodes are noted which may be inflammatory or neoplastic in etiology. Follow-up CT scan in 3 months is recommended to ensure resolution and rule out neoplasm. 4. Aortic atherosclerosis." -She was given a dose of IV Lasix -Inflammatory markers were elevated with a sed rate of 78 and a CRP of 11.9 but likely in the setting of infection as above -her antibiotics were escalated to IV Zosyn but will be transitioned to p.o.  Augmentin at discharge for 6 more days  Advanced Dementia with Behavioral Disturbances -Patient is oriented to name only.Completely dependent for all her ADL, lives with her daughter. -Per daughter she will come back home,she is not interested in any facility but per nursing she lives most of the day by herself so we will need PT and OT to evaluate further and have case management evaluate if there is a safe  discharge disposition for her home -Continue home dose of donepezil 5 mg p.o. nightly. -Received IV Lorazepam 0.5 mg x1  a few nights ago and Haldol was not used due to prolonged QT  Chronic Back pain. -Continue home dose of Tramadol 50 mg p.o. every 12 as needed moderate and severe as needed.  Hypertension -Patient was on Ramipril 10 mg daily at home and recently taken off by her PCP due to labile blood pressure. -Continue to monitor without any medication at this time. -Continue monitor blood pressures per protocol -Last blood pressure was 150/72  Abnormal LFTs -Patient's AST was 82 on admission her ALT was 46 and is likely reactive in the setting of possible dehydration; Now AST is 22 and ALT is 34 -Continue monitor and trend hepatic function panel repeat CMP in a.m. -If still persistently elevated will obtain a right upper quadrant ultrasound as well as an acute hepatitis panel -Repeat CMP in a.m.  Hyponatremia -Was thought to be in the setting of dehydration -Patient's sodium level improved from 133 is now 139 with IV fluid hydration -Hydration also  Normocytic Anemia -Patient's hemoglobin/hematocrit went from 11.8/36.6 on admission -> 11.3/35.2 -> 13.0/39.0 -> 11.3/33.7 and is now 11.1/32.7 -Check Anemia panel in a.m. as an outpatient -IVF Hydration now stopped -Continue to monitor for signs and symptoms of bleeding; currently no overt bleeding noted -Repeat CBC in a.m.  Metabolic Acidosis, improving slowly -Mild -Patient's CO2 was now 19, anion gap was 15, chloride level was 105; Now CO2 is 23, AG is 12, and Chloride Level is 104 -Continue to monitor and trend repeat CMP in a.m.  Hyperglycemia -Likely reactive but will need to rule out Diabetes -Check HbA1c as an outpatient -Continue to Monitor Blood Sugars carefully and if necessary will place on Sensitive Novolog SSI AC -Patient's Blood Sugar has been ranging from 109-120 on Daily BMP/CMP  Hx of CLL -She  was seen by hematology in 05/26/2019 and had minimal progression of any. She was diagnosed at that time stage 0 CLL with a WBC of 24,000, hemoglobin 11.7, and a platelet count of 468 -Today her WBC is 35.8, hemoglobin/hematocrit 11.3/30.7, and platelet count is 416 -We will obtain an anemia panel  -Will discuss with Oncology about further evaluation and recommendations -Given her labs today do not feel that this is progression of her CLL as her lymphocyte count has actually trended down  Thrombocytosis -Mild -Patient's Platelet Count went from 367 -> 355 -> 416 and is now 469 and trended down to 450 -Continue to Monitor and Trend -Repeat CBC within 1 week  GOC: DNR, poA -Patient's family wanted the patient home so we'll discharge them home at their request and have outpatient palliative care referral. Patient will likely continue to aspirate and will transition her to p.o. antibiotics; if patient fails to significantly improve a outpatient hospice referral can be made in  Discharge Instructions  Discharge Instructions    ([place]) Call MD:  Anytime you have any of the following symptoms: 1) 3 pound weight gain in 24 hours or 5  pounds in 1 week 2) shortness of breath, with or without a dry hacking cough 3) swelling in the hands, feet or stomach 4) if you have to sleep on extra pillows at night in order to breathe.   Complete by: As directed    Call MD for:  difficulty breathing, headache or visual disturbances   Complete by: As directed    Call MD for:  extreme fatigue   Complete by: As directed    Call MD for:  hives   Complete by: As directed    Call MD for:  persistant dizziness or light-headedness   Complete by: As directed    Call MD for:  persistant nausea and vomiting   Complete by: As directed    Call MD for:  redness, tenderness, or signs of infection (pain, swelling, redness, odor or green/yellow discharge around incision site)   Complete by: As directed     Call MD for:  severe uncontrolled pain   Complete by: As directed    Call MD for:  temperature >100.4   Complete by: As directed    Diet - low sodium heart healthy   Complete by: As directed    DYSPHAGIA 2 DIET (FINE CHOP) with Thin Liquids   Increase activity slowly   Complete by: As directed      Allergies as of 06/02/2020      Reactions   Amoxicillin    Unknown      Medication List    TAKE these medications   acetaminophen 325 MG tablet Commonly known as: TYLENOL Take 2 tablets (650 mg total) by mouth every 6 (six) hours as needed for mild pain (or Fever >/= 101).   amoxicillin-clavulanate 875-125 MG tablet Commonly known as: AUGMENTIN Take 1 tablet by mouth every 12 (twelve) hours for 6 days.   donepezil 5 MG tablet Commonly known as: Aricept Take 1 tablet (5 mg total) by mouth at bedtime.   ondansetron 4 MG tablet Commonly known as: ZOFRAN Take 1 tablet (4 mg total) by mouth every 6 (six) hours as needed for nausea.   polyethylene glycol 17 g packet Commonly known as: MIRALAX / GLYCOLAX Take 17 g by mouth daily as needed for mild constipation.   Shingrix injection Generic drug: Zoster Vaccine Adjuvanted Inject 0.22m IM now and again in 2-6 months.   traMADol 50 MG tablet Commonly known as: ULTRAM Take 50 mg by mouth 4 (four) times daily.       Follow-up Information    [facility]   Specialty: Home Health Services Why: YPyoteRN and Home Health aide [facility] The office will contact you with start of service info. If you have any questions please call number listed above.  Contact information: [phone]              Allergies  Allergen Reactions  . Amoxicillin     Unknown   Consultations:  Discussed case with Infectious Diseases   Procedures/Studies: CT Head Wo Contrast  Result Date: 05/29/2020 CLINICAL DATA:  Acute neurologic  deficit, stroke, hypertension, breast cancer EXAM: CT HEAD WITHOUT CONTRAST TECHNIQUE: Contiguous axial images were obtained from the base of the skull through the vertex without intravenous contrast. COMPARISON:  MRI 06/18/2017 FINDINGS: Brain: Normal anatomic configuration. Moderate parenchymal volume loss is commensurate with the patient's age. The degree of parenchymal volume loss has progressed since prior examination, however. Mild  periventricular white matter changes are present likely reflecting the sequela of small vessel ischemia. Tiny remote infarct noted within the right cerebellar hemisphere no abnormal intra or extra-axial mass lesion or fluid collection. No abnormal mass effect or midline shift. No evidence of acute intracranial hemorrhage or infarct. The ventricles are mildly dilated, stable since prior examination and commensurate with the degree of parenchymal volume loss. Cerebellum unremarkable. Vascular: No asymmetric hyperdense vasculature at the skull base. Skull: Intact Sinuses/Orbits: There is dense opacification of the left maxillary sinus as well as multiple left ethmoid air cells. Mild mucosal thickening is seen within the frontal sinuses, left greater than right. The orbits are unremarkable. Other: There is fluid opacification of the left mastoid air cells without associated osseous erosion, similar to that noted on prior examination. Right mastoid air cells are clear. Middle ear cavities are clear. IMPRESSION: No evidence of acute intracranial hemorrhage or infarct. Advanced senescent changes with progressive parenchymal atrophy since prior examination. Mild ventriculomegaly is stable and likely reflects the sequela of central atrophy. Stable left mastoid effusion. Extensive predominantly left-sided paranasal sinus disease with near complete opacification of the left maxillary sinus. Electronically Signed   By: Fidela Salisbury MD   On: 05/29/2020 21:59   CT CHEST W CONTRAST  Result  Date: 06/01/2020 CLINICAL DATA:  Pneumonia or abscess. EXAM: CT CHEST WITH CONTRAST TECHNIQUE: Multidetector CT imaging of the chest was performed during intravenous contrast administration. CONTRAST:  15m OMNIPAQUE IOHEXOL 300 MG/ML  SOLN COMPARISON:  Radiograph of same day. FINDINGS: Cardiovascular: Atherosclerosis of thoracic aorta is noted without aneurysm or dissection. Mild cardiomegaly is noted. No pericardial effusion is noted. Mediastinum/Nodes: Thyroid gland and esophagus are unremarkable. 2.4 cm subcarinal adenopathy is noted. 1.8 cm pretracheal lymph node is noted. 9 mm left hilar lymph node is noted. 1.7 cm right hilar lymph node is noted. Lungs/Pleura: Moderate right pleural effusion is noted with adjacent atelectasis or pneumonia of the right lower lobe. No pneumothorax is noted. There is also noted right middle lobe opacity with air bronchograms concerning for pneumonia. Abnormal soft tissue density is noted in the right middle lobe bronchus consistent with mucous plugging or aspirated material. Some degree of bronchiectasis is noted in the right lower lobe. Minimal left basilar subsegmental atelectasis is noted. Upper Abdomen: No acute abnormality. Musculoskeletal: No chest wall abnormality. No acute or significant osseous findings. IMPRESSION: 1. Moderate right pleural effusion is noted with adjacent atelectasis or pneumonia of the right lower lobe. 2. There is also noted right middle lobe opacity with air bronchograms concerning for pneumonia. Abnormal soft tissue density is noted in the right middle lobe bronchus consistent with mucous plugging or aspirated material. Some degree of bronchiectasis is noted in the right lower lobe. 3. Enlarged mediastinal and bilateral hilar lymph nodes are noted which may be inflammatory or neoplastic in etiology. Follow-up CT scan in 3 months is recommended to ensure resolution and rule out neoplasm. 4. Aortic atherosclerosis. Aortic Atherosclerosis  (ICD10-I70.0). Electronically Signed   By: JMarijo ConceptionM.D.   On: 06/01/2020 16:29   DG CHEST PORT 1 VIEW  Result Date: 06/01/2020 CLINICAL DATA:  Dyspnea. EXAM: PORTABLE CHEST 1 VIEW COMPARISON:  May 29, 2020. FINDINGS: Stable cardiomediastinal silhouette. No pneumothorax is noted. Rounded density is seen in right infrahilar region concerning for possible mass or neoplasm. Bibasilar atelectasis or edema is noted with possible small right pleural effusion. Bony thorax is unremarkable. IMPRESSION: Rounded density seen in right infrahilar region concerning for possible mass or  neoplasm. Bibasilar atelectasis or edema is noted with possible small right pleural effusion. CT scan of the chest is recommended for further evaluation. Electronically Signed   By: Marijo Conception M.D.   On: 06/01/2020 09:38   DG Chest Port 1 View  Result Date: 05/29/2020 CLINICAL DATA:  Cough, chest congestion EXAM: PORTABLE CHEST 1 VIEW COMPARISON:  01/05/2014 FINDINGS: There is focal consolidation at the right lung base, possibly infectious in the appropriate clinical setting. Small bilateral pleural effusions are present, left greater than right. Cardiac size is within normal limits. Pulmonary vascularity is normal. No pneumothorax. No acute bone abnormality. Surgical clips are seen within the left axilla. IMPRESSION: Right basilar consolidation possibly reflecting changes of acute lobar pneumonia in the appropriate clinical setting. Small bilateral pleural effusions. Electronically Signed   By: Fidela Salisbury MD   On: 05/29/2020 21:52   DG Swallowing Func-Speech Pathology  Result Date: 05/31/2020 Objective Swallowing Evaluation: Type of Study: MBS-Modified Barium Swallow Study  Patient Details Name: Kinleigh Nault MRN: [id number] Date of Birth: Aug 07, 1927 Today's Date: 05/31/2020 Time: SLP Start Time (ACUTE ONLY): 1258 -SLP Stop Time (ACUTE ONLY): 1314 SLP Time Calculation (min) (ACUTE ONLY): 16 min Past Medical  History: Past Medical History: Diagnosis Date . Breast cancer ([place])  . Chronic low back pain  . CLL (chronic lymphocytic leukemia) ([place])  . Dehydration  . Hypertension  . Memory loss  . Sacral fracture ([place]) 03/2019 Past Surgical History: Past Surgical History: Procedure Laterality Date . MASTECTOMY   HPI: Pt is a 86 y.o. female with medical history significant of advanced dementia, hypertension, breast cancer and lymphoma survivor who was brought to [facility] with complaint of some cough and upper respiratory symptoms more prominent with eating and drinking for the past week prior to admission. CXR 12/13: Right basilar consolidation possibly reflecting changes of acute lobar pneumonia in the appropriate clinical setting.  No data recorded Assessment / Plan / Recommendation CHL IP CLINICAL IMPRESSIONS 05/31/2020 Clinical Impression Pt was seen in radiology suite for modified barium swallow study. Trials of puree solids, regular texture solids, and thin liquids via cup and straw were administered. She demonstrated oral phase dysphagia characterized by reduced posterior bolus propulsion and lingual rocking. Coughing was once noted during the study; however, the pharyngeal phase of her swallow was within normal limits and no instances of penetration or aspiration were noted. It is recommended that a dysphagia 2 diet with thin liquids be initiated at this time. SLP will see patient once more to ensure tolerance of the diet; however, further skilled SLP services will likely not be needed beyond that. SLP Visit Diagnosis Dysphagia, unspecified (R13.10) Attention and concentration deficit following -- Frontal lobe and executive function deficit following -- Impact on safety and function Mild aspiration risk   CHL IP TREATMENT RECOMMENDATION 05/31/2020 Treatment Recommendations Therapy as outlined in treatment plan below   Prognosis 05/31/2020 Prognosis for Safe Diet Advancement Fair Barriers to Reach Goals Cognitive deficits  Barriers/Prognosis Comment -- CHL IP DIET RECOMMENDATION 05/31/2020 SLP Diet Recommendations Dysphagia 2 (Fine chop) solids;Thin liquid Liquid Administration via Straw;Cup Medication Administration Crushed with puree Compensations Slow rate;Minimize environmental distractions Postural Changes Remain semi-upright after after feeds/meals (Comment)   CHL IP OTHER RECOMMENDATIONS 05/31/2020 Recommended Consults -- Oral Care Recommendations Oral care BID Other Recommendations --   CHL IP FOLLOW UP RECOMMENDATIONS 05/31/2020 Follow up Recommendations None   CHL IP FREQUENCY AND DURATION 05/31/2020 Speech Therapy Frequency (ACUTE ONLY) min 2x/week Treatment Duration 1 week  CHL IP ORAL PHASE 05/31/2020 Oral Phase Impaired Oral - Pudding Teaspoon -- Oral - Pudding Cup -- Oral - Honey Teaspoon -- Oral - Honey Cup -- Oral - Nectar Teaspoon -- Oral - Nectar Cup -- Oral - Nectar Straw -- Oral - Thin Teaspoon -- Oral - Thin Cup Lingual pumping;Reduced posterior propulsion Oral - Thin Straw Lingual pumping;Reduced posterior propulsion Oral - Puree Lingual pumping;Reduced posterior propulsion Oral - Mech Soft -- Oral - Regular Lingual pumping;Reduced posterior propulsion Oral - Multi-Consistency -- Oral - Pill -- Oral Phase - Comment --  CHL IP PHARYNGEAL PHASE 05/31/2020 Pharyngeal Phase WFL Pharyngeal- Pudding Teaspoon -- Pharyngeal -- Pharyngeal- Pudding Cup -- Pharyngeal -- Pharyngeal- Honey Teaspoon -- Pharyngeal -- Pharyngeal- Honey Cup -- Pharyngeal -- Pharyngeal- Nectar Teaspoon -- Pharyngeal -- Pharyngeal- Nectar Cup -- Pharyngeal -- Pharyngeal- Nectar Straw -- Pharyngeal -- Pharyngeal- Thin Teaspoon -- Pharyngeal -- Pharyngeal- Thin Cup WFL Pharyngeal -- Pharyngeal- Thin Straw WFL Pharyngeal -- Pharyngeal- Puree WFL Pharyngeal -- Pharyngeal- Mechanical Soft WFL Pharyngeal -- Pharyngeal- Regular WFL Pharyngeal -- Pharyngeal- Multi-consistency NT Pharyngeal -- Pharyngeal- Pill NT Pharyngeal -- Pharyngeal Comment --   CHL IP CERVICAL ESOPHAGEAL PHASE 05/31/2020 Cervical Esophageal Phase WFL Pudding Teaspoon -- Pudding Cup -- Honey Teaspoon -- Honey Cup -- Nectar Teaspoon -- Nectar Cup -- Nectar Straw -- Thin Teaspoon -- Thin Cup -- Thin Straw -- Puree -- Mechanical Soft -- Regular -- Multi-consistency -- Pill -- Cervical Esophageal Comment -- Shanika I. Hardin Negus, [place], [place] Office number [phone] Pager [phone] Horton Marshall 05/31/2020, 1:35 PM              ECHOCARDIOGRAM COMPLETE  Result Date: 06/01/2020    ECHOCARDIOGRAM REPORT   Patient Name:   PEARLA MCKINNY Date of Exam: 06/01/2020 Medical Rec #:  [id number]      Height:       64.0 in Accession #:    [id number]     Weight:       108.9 lb Date of Birth:  [birth year]      BSA:          1.511 m Patient Age:    13 years       BP:           166/80 mmHg Patient Gender: F              HR:           103 bpm. Exam Location:  Inpatient Procedure: 2D Echo, Color Doppler and Cardiac Doppler Indications:    Bacteremia R78.81  History:        Patient has no prior history of Echocardiogram examinations.                 Risk Factors:Hypertension.  Sonographer:    Bernadene Person RDCS Referring Phys: [id number] Georgina Quint LATIF [place]  1. Left ventricular ejection fraction, by estimation, is 55 to 60%. The left ventricle has normal function. The left ventricle has no regional wall motion abnormalities. Left ventricular diastolic parameters are consistent with Grade I diastolic dysfunction (impaired relaxation). Elevated left atrial pressure.  2. Right ventricular systolic function is normal. The right ventricular size is normal.  3. The mitral valve is normal in structure. Trivial mitral valve regurgitation. No evidence of mitral stenosis.  4. The aortic valve is tricuspid. Aortic valve regurgitation is trivial. Mild aortic valve sclerosis is present, with no evidence of aortic valve stenosis.  5. The inferior vena cava is normal in size  with  greater than 50% respiratory variability, suggesting right atrial pressure of 3 mmHg. FINDINGS  Left Ventricle: Left ventricular ejection fraction, by estimation, is 55 to 60%. The left ventricle has normal function. The left ventricle has no regional wall motion abnormalities. The left ventricular internal cavity size was normal in size. There is  no left ventricular hypertrophy. Left ventricular diastolic parameters are consistent with Grade I diastolic dysfunction (impaired relaxation). Elevated left atrial pressure. Right Ventricle: The right ventricular size is normal. Right ventricular systolic function is normal. Left Atrium: Left atrial size was normal in size. Right Atrium: Right atrial size was normal in size. Pericardium: There is no evidence of pericardial effusion. Mitral Valve: The mitral valve is normal in structure. Mild mitral annular calcification. Trivial mitral valve regurgitation. No evidence of mitral valve stenosis. Tricuspid Valve: The tricuspid valve is normal in structure. Tricuspid valve regurgitation is trivial. No evidence of tricuspid stenosis. Aortic Valve: The aortic valve is tricuspid. Aortic valve regurgitation is trivial. Aortic regurgitation PHT measures 161 msec. Mild aortic valve sclerosis is present, with no evidence of aortic valve stenosis. Pulmonic Valve: The pulmonic valve was normal in structure. Pulmonic valve regurgitation is not visualized. No evidence of pulmonic stenosis. Aorta: The aortic root is normal in size and structure. Venous: The inferior vena cava is normal in size with greater than 50% respiratory variability, suggesting right atrial pressure of 3 mmHg.  LEFT VENTRICLE PLAX 2D LVIDd:         4.20 cm     Diastology LVIDs:         3.10 cm     LV e' medial:    4.35 cm/s LV PW:         0.90 cm     LV E/e' medial:  21.4 LV IVS:        1.10 cm     LV e' lateral:   6.92 cm/s LVOT diam:     2.10 cm     LV E/e' lateral: 13.5 LV SV:         74 LV SV Index:   49 LVOT  Area:     3.46 cm  LV Volumes (MOD) LV vol d, MOD A4C: 77.4 ml LV vol s, MOD A4C: 32.5 ml LV SV MOD A4C:     77.4 ml RIGHT VENTRICLE RV S prime:     14.50 cm/s TAPSE (M-mode): 1.9 cm LEFT ATRIUM             Index       RIGHT ATRIUM          Index LA diam:        5.00 cm 3.31 cm/m  RA Area:     9.20 cm LA Vol (A2C):   42.4 ml 28.06 ml/m RA Volume:   16.10 ml 10.66 ml/m LA Vol (A4C):   42.9 ml 28.39 ml/m LA Biplane Vol: 42.8 ml 28.33 ml/m  AORTIC VALVE LVOT Vmax:   102.00 cm/s LVOT Vmean:  67.500 cm/s LVOT VTI:    0.213 m AI PHT:      161 msec  AORTA Ao Root diam: 3.60 cm Ao Asc diam:  3.30 cm MITRAL VALVE MV Area (PHT): 4.36 cm     SHUNTS MV Decel Time: 174 msec     Systemic VTI:  0.21 m MV E velocity: 93.10 cm/s   Systemic Diam: 2.10 cm MV A velocity: 114.00 cm/s MV E/A ratio:  0.82 Kirk Ruths MD Electronically signed by Kirk Ruths  MD Signature Date/Time: 06/01/2020/2:33:00 PM    Final     Subjective: And examined at bedside and patient was unable to provide a subjective history due to her current condition. She appeared stable and and not in acute distress. Case was extensively discussed with patient's 2 daughters over the telephone via conference call and the daughters wanted the patient to come home and have her antibiotic transition to p.o. They did not want to be aggressive with the pleural effusion and I discussed with them that thoracentesis could be an option but they declined this. We'll try IV Lasix once prior to patient leaving but she'll be discharged home with home health services and have an outpatient palliative care referral. She'll be transitioned to p.o. antibiotics at this time and is explained to the family that she has a very high risk of recurrent aspiration and they understand and agree with the plan of care at this time and still with the patient home.  Discharge Exam: Vitals:   06/02/20 1039 06/02/20 1153  BP: (!) 150/72   Pulse: (!) 101   Resp: 19   Temp: 98.6 F  (37 C)   SpO2: 92% 93%   Vitals:   06/02/20 0512 06/02/20 0904 06/02/20 1039 06/02/20 1153  BP: (!) 144/66 (!) 158/76 (!) 150/72   Pulse: (!) 103  (!) 101   Resp: 20 (!) 27 19   Temp: (!) 97.4 F (36.3 C) 98.3 F (36.8 C) 98.6 F (37 C)   TempSrc: Oral Oral Oral   SpO2: 95% 91% 92% 93%  Weight:      Height:       General: Pt is awake but not fully alert or oriented and she is not in acute distress Cardiovascular: RRR, S1/S2 +, no rubs, no gallops Respiratory: Diminished bilaterally with coarse breath sounds worse on the right compared to left with rhonchi and crackles, no wheezing, no rhonchi Abdominal: Soft, NT, ND, bowel sounds + Extremities: Minimal edema, no cyanosis  The results of significant diagnostics from this hospitalization (including imaging, microbiology, ancillary and laboratory) are listed below for reference.     Microbiology: Recent Results (from the past 240 hour(s))  Resp Panel by RT-PCR (Flu A&B, Covid) Nasopharyngeal Swab     Status: None   Collection Time: 05/29/20  9:10 PM   Specimen: Nasopharyngeal Swab; Nasopharyngeal(NP) swabs in vial transport medium  Result Value Ref Range Status   SARS Coronavirus 2 by RT PCR NEGATIVE NEGATIVE Final    Comment: (NOTE) SARS-CoV-2 target nucleic acids are NOT DETECTED.  The SARS-CoV-2 RNA is generally detectable in upper respiratory specimens during the acute phase of infection. The lowest concentration of SARS-CoV-2 viral copies this assay can detect is 138 copies/mL. A negative result does not preclude SARS-Cov-2 infection and should not be used as the sole basis for treatment or other patient management decisions. A negative result may occur with  improper specimen collection/handling, submission of specimen other than nasopharyngeal swab, presence of viral mutation(s) within the areas targeted by this assay, and inadequate number of viral copies(<138 copies/mL). A negative result must be combined  with clinical observations, patient history, and epidemiological information. The expected result is Negative.  Fact Sheet for Patients:  EntrepreneurPulse.com.au  Fact Sheet for Healthcare Providers:  IncredibleEmployment.be  This test is no t yet approved or cleared by the [place] FDA and  has been authorized for detection and/or diagnosis of SARS-CoV-2 by FDA under an Emergency Use Authorization (EUA). This EUA will remain  in effect (meaning this test can be used) for the duration of the COVID-19 declaration under Section 564(b)(1) of the Act, 21 U.S.C.section 360bbb-3(b)(1), unless the authorization is terminated  or revoked sooner.       Influenza A by PCR NEGATIVE NEGATIVE Final   Influenza B by PCR NEGATIVE NEGATIVE Final    Comment: (NOTE) The Xpert Xpress SARS-CoV-2/FLU/RSV plus assay is intended as an aid in the diagnosis of influenza from Nasopharyngeal swab specimens and should not be used as a sole basis for treatment. Nasal washings and aspirates are unacceptable for Xpert Xpress SARS-CoV-2/FLU/RSV testing.  Fact Sheet for Patients: EntrepreneurPulse.com.au  Fact Sheet for Healthcare Providers: IncredibleEmployment.be  This test is not yet approved or cleared by the [place] FDA and has been authorized for detection and/or diagnosis of SARS-CoV-2 by FDA under an Emergency Use Authorization (EUA). This EUA will remain in effect (meaning this test can be used) for the duration of the COVID-19 declaration under Section 564(b)(1) of the Act, 21 U.S.C. section 360bbb-3(b)(1), unless the authorization is terminated or revoked.  Performed at [facility], [address] [place], [place] [address]   Culture, blood (routine x 2)     Status: Abnormal   Collection Time: 05/29/20 10:10 PM   Specimen: BLOOD  Result Value Ref Range Status   Specimen Description   Final     BLOOD RIGHT ANTECUBITAL Performed at [facility], [place] [address], [place], [place] [address]    Special Requests   Final    BOTTLES DRAWN AEROBIC AND ANAEROBIC Blood Culture adequate volume Performed at [facility], [address] [place], [place] [address]    Culture  Setup Time   Final    GRAM POSITIVE COCCI AEROBIC BOTTLE ONLY CRITICAL RESULT CALLED TO, READ BACK BY AND VERIFIED WITH: Ferne Coe PharmD 10:10 05/31/20 ([email or web address])    Culture (A)  Final    STAPHYLOCOCCUS HOMINIS THE SIGNIFICANCE OF ISOLATING THIS ORGANISM FROM A SINGLE SET OF BLOOD CULTURES WHEN MULTIPLE SETS ARE DRAWN IS UNCERTAIN. PLEASE NOTIFY THE MICROBIOLOGY DEPARTMENT WITHIN ONE WEEK IF SPECIATION AND SENSITIVITIES ARE REQUIRED. Performed at [facility], [place] [address] [place], [place] [address]    Report Status 06/02/2020 FINAL  Final  Blood Culture ID Panel (Reflexed)     Status: Abnormal   Collection Time: 05/29/20 10:10 PM  Result Value Ref Range Status   Enterococcus faecalis NOT DETECTED NOT DETECTED Final   Enterococcus Faecium NOT DETECTED NOT DETECTED Final   Listeria monocytogenes NOT DETECTED NOT DETECTED Final   Staphylococcus species DETECTED (A) NOT DETECTED Final    Comment: CRITICAL RESULT CALLED TO, READ BACK BY AND VERIFIED WITH: Ferne Coe PharmD 10:10 05/31/20 ([email or web address])    Staphylococcus aureus (BCID) NOT DETECTED NOT DETECTED Final   Staphylococcus epidermidis NOT DETECTED NOT DETECTED Final   Staphylococcus lugdunensis NOT DETECTED NOT DETECTED Final   Streptococcus species NOT DETECTED NOT DETECTED Final   Streptococcus agalactiae NOT DETECTED NOT DETECTED Final   Streptococcus pneumoniae NOT DETECTED NOT DETECTED Final   Streptococcus pyogenes NOT DETECTED NOT DETECTED Final   A.calcoaceticus-baumannii NOT DETECTED NOT DETECTED Final   Bacteroides fragilis NOT DETECTED NOT DETECTED Final   Enterobacterales NOT DETECTED NOT DETECTED Final   Enterobacter  cloacae complex NOT DETECTED NOT DETECTED Final   Escherichia coli NOT DETECTED NOT DETECTED Final   Klebsiella aerogenes NOT DETECTED NOT DETECTED Final   Klebsiella oxytoca NOT DETECTED NOT DETECTED  Final   Klebsiella pneumoniae NOT DETECTED NOT DETECTED Final   Proteus species NOT DETECTED NOT DETECTED Final   Salmonella species NOT DETECTED NOT DETECTED Final   Serratia marcescens NOT DETECTED NOT DETECTED Final   Haemophilus influenzae NOT DETECTED NOT DETECTED Final   Neisseria meningitidis NOT DETECTED NOT DETECTED Final   Pseudomonas aeruginosa NOT DETECTED NOT DETECTED Final   Stenotrophomonas maltophilia NOT DETECTED NOT DETECTED Final   Candida albicans NOT DETECTED NOT DETECTED Final   Candida auris NOT DETECTED NOT DETECTED Final   Candida glabrata NOT DETECTED NOT DETECTED Final   Candida krusei NOT DETECTED NOT DETECTED Final   Candida parapsilosis NOT DETECTED NOT DETECTED Final   Candida tropicalis NOT DETECTED NOT DETECTED Final   Cryptococcus neoformans/gattii NOT DETECTED NOT DETECTED Final    Comment: Performed at [facility], [place] [address] [place], [place] [address]  Culture, blood (routine x 2)     Status: Abnormal   Collection Time: 05/29/20 10:13 PM   Specimen: BLOOD RIGHT WRIST  Result Value Ref Range Status   Specimen Description   Final    BLOOD RIGHT WRIST Performed at [facility], [address] [place], [place] [address]    Special Requests   Final    BOTTLES DRAWN AEROBIC AND ANAEROBIC Blood Culture results may not be optimal due to an inadequate volume of blood received in culture bottles Performed at [facility], [address] [place], [place] [address]    Culture  Setup Time   Final    GRAM POSITIVE COCCI IN CHAINS IN BOTH AEROBIC AND ANAEROBIC BOTTLES Organism ID to follow CRITICAL RESULT CALLED TO, READ BACK BY AND VERIFIED WITH: Ferne Coe PharmD 10:10 05/31/20 ([email or web address])    Culture (A)  Final     STREPTOCOCCUS MUTANS STREPTOCOCCUS GORDONII THE SIGNIFICANCE OF ISOLATING THIS ORGANISM FROM A SINGLE SET OF BLOOD CULTURES WHEN MULTIPLE SETS ARE DRAWN IS UNCERTAIN. PLEASE NOTIFY THE MICROBIOLOGY DEPARTMENT WITHIN ONE WEEK IF SPECIATION AND SENSITIVITIES ARE REQUIRED. Performed at [facility], [place] [address] [place], [place] [address]    Report Status 06/02/2020 FINAL  Final  Blood Culture ID Panel (Reflexed)     Status: Abnormal   Collection Time: 05/29/20 10:13 PM  Result Value Ref Range Status   Enterococcus faecalis NOT DETECTED NOT DETECTED Final   Enterococcus Faecium NOT DETECTED NOT DETECTED Final   Listeria monocytogenes NOT DETECTED NOT DETECTED Final   Staphylococcus species NOT DETECTED NOT DETECTED Final   Staphylococcus aureus (BCID) NOT DETECTED NOT DETECTED Final   Staphylococcus epidermidis NOT DETECTED NOT DETECTED Final   Staphylococcus lugdunensis NOT DETECTED NOT DETECTED Final   Streptococcus species DETECTED (A) NOT DETECTED Final    Comment: Not Enterococcus species, Streptococcus agalactiae, Streptococcus pyogenes, or Streptococcus pneumoniae. CRITICAL RESULT CALLED TO, READ BACK BY AND VERIFIED WITH: Ferne Coe PharmD 10:10 05/31/20 ([email or web address])    Streptococcus agalactiae NOT DETECTED NOT DETECTED Final   Streptococcus pneumoniae NOT DETECTED NOT DETECTED Final   Streptococcus pyogenes NOT DETECTED NOT DETECTED Final   A.calcoaceticus-baumannii NOT DETECTED NOT DETECTED Final   Bacteroides fragilis NOT DETECTED NOT DETECTED Final   Enterobacterales NOT DETECTED NOT DETECTED Final   Enterobacter cloacae complex NOT DETECTED NOT DETECTED Final   Escherichia coli NOT DETECTED NOT DETECTED Final   Klebsiella aerogenes NOT DETECTED NOT DETECTED Final   Klebsiella oxytoca NOT DETECTED NOT DETECTED Final   Klebsiella pneumoniae NOT DETECTED NOT DETECTED  Final   Proteus species NOT DETECTED NOT DETECTED Final   Salmonella species NOT DETECTED NOT DETECTED  Final   Serratia marcescens NOT DETECTED NOT DETECTED Final   Haemophilus influenzae NOT DETECTED NOT DETECTED Final   Neisseria meningitidis NOT DETECTED NOT DETECTED Final   Pseudomonas aeruginosa NOT DETECTED NOT DETECTED Final   Stenotrophomonas maltophilia NOT DETECTED NOT DETECTED Final   Candida albicans NOT DETECTED NOT DETECTED Final   Candida auris NOT DETECTED NOT DETECTED Final   Candida glabrata NOT DETECTED NOT DETECTED Final   Candida krusei NOT DETECTED NOT DETECTED Final   Candida parapsilosis NOT DETECTED NOT DETECTED Final   Candida tropicalis NOT DETECTED NOT DETECTED Final   Cryptococcus neoformans/gattii NOT DETECTED NOT DETECTED Final    Comment: Performed at [facility], [address], [place], [place] [address]  Culture, blood (routine x 2)     Status: None (Preliminary result)   Collection Time: 05/31/20  3:05 PM   Specimen: BLOOD  Result Value Ref Range Status   Specimen Description BLOOD RIGHT ANTECUBITAL  Final   Special Requests   Final    BOTTLES DRAWN AEROBIC AND ANAEROBIC Blood Culture adequate volume   Culture   Final    NO GROWTH 2 DAYS Performed at [facility], [place] [address], [place], [place] [address]    Report Status PENDING  Incomplete  Culture, blood (routine x 2)     Status: None (Preliminary result)   Collection Time: 05/31/20  3:13 PM   Specimen: BLOOD RIGHT ARM  Result Value Ref Range Status   Specimen Description BLOOD RIGHT ARM  Final   Special Requests   Final    BOTTLES DRAWN AEROBIC AND ANAEROBIC Blood Culture results may not be optimal due to an inadequate volume of blood received in culture bottles   Culture   Final    NO GROWTH 2 DAYS Performed at [facility], [place] [address] [place],  [address]    Report Status PENDING  Incomplete    Labs: BNP (last 3 results) No results for input(s): BNP in the last 8760 hours. Basic Metabolic Panel: Recent Labs  Lab 05/29/20 2110 05/30/20 1918  05/31/20 0200 06/01/20 0852 06/02/20 0843  NA 133*  --  139 140 139  K 3.6  --  3.5 3.5 3.0*  CL 97*  --  105 105 104  CO2 25  --  19* 20* 23  GLUCOSE 120*  --  109* 127* 117*  BUN 17  --  10 11 12   CREATININE 0.86 0.73 0.76 0.71 0.85  CALCIUM 8.7*  --  9.0 8.5* 8.6*  MG  --   --   --  1.9 1.7  PHOS  --   --   --  2.9 2.6   Liver Function Tests: Recent Labs  Lab 05/29/20 2110 06/01/20 0852 06/02/20 0843  AST 82* 36 22  ALT 46* 43 34  ALKPHOS 109 82 72  BILITOT 0.2* 0.6 0.7  PROT 7.6 6.2* 6.3*  ALBUMIN 3.0* 2.5* 2.5*   No results for input(s): LIPASE, AMYLASE in the last 168 hours. No results for input(s): AMMONIA in the last 168 hours. CBC: Recent Labs  Lab 05/29/20 2110 05/30/20 1918 05/31/20 0200 06/01/20 0852 06/01/20 1710 06/02/20 0843  WBC 23.2* 22.5* 15.3* 35.8* 35.1* 31.4*  NEUTROABS 13.8*  --   --  28.7* 30.5* 25.1*  HGB 11.8* 11.3* 13.0 11.3* 11.4* 11.1*  HCT 36.6 35.2* 39.0 33.7*  35.5* 32.7*  MCV 87.1 85.2 84.8 83.4 84.3 83.4  PLT 367 366 355 416* 469* 450*   Cardiac Enzymes: No results for input(s): CKTOTAL, CKMB, CKMBINDEX, TROPONINI in the last 168 hours. BNP: Invalid input(s): POCBNP CBG: No results for input(s): GLUCAP in the last 168 hours. D-Dimer No results for input(s): DDIMER in the last 72 hours. Hgb A1c No results for input(s): HGBA1C in the last 72 hours. Lipid Profile No results for input(s): CHOL, HDL, LDLCALC, TRIG, CHOLHDL, LDLDIRECT in the last 72 hours. Thyroid function studies No results for input(s): TSH, T4TOTAL, T3FREE, THYROIDAB in the last 72 hours.  Invalid input(s): FREET3 Anemia work up No results for input(s): VITAMINB12, FOLATE, FERRITIN, TIBC, IRON, RETICCTPCT in the last 72 hours. Urinalysis    Component Value Date/Time   COLORURINE YELLOW 05/30/2020 0700   APPEARANCEUR CLEAR 05/30/2020 0700   LABSPEC 1.020 05/30/2020 0700   PHURINE 6.5 05/30/2020 0700   GLUCOSEU NEGATIVE 05/30/2020 0700   HGBUR TRACE  (A) 05/30/2020 0700   BILIRUBINUR NEGATIVE 05/30/2020 0700   KETONESUR NEGATIVE 05/30/2020 0700   PROTEINUR NEGATIVE 05/30/2020 0700   NITRITE NEGATIVE 05/30/2020 0700   LEUKOCYTESUR SMALL (A) 05/30/2020 0700   Sepsis Labs Invalid input(s): PROCALCITONIN,  WBC,  LACTICIDVEN Microbiology Recent Results (from the past 240 hour(s))  Resp Panel by RT-PCR (Flu A&B, Covid) Nasopharyngeal Swab     Status: None   Collection Time: 05/29/20  9:10 PM   Specimen: Nasopharyngeal Swab; Nasopharyngeal(NP) swabs in vial transport medium  Result Value Ref Range Status   SARS Coronavirus 2 by RT PCR NEGATIVE NEGATIVE Final    Comment: (NOTE) SARS-CoV-2 target nucleic acids are NOT DETECTED.  The SARS-CoV-2 RNA is generally detectable in upper respiratory specimens during the acute phase of infection. The lowest concentration of SARS-CoV-2 viral copies this assay can detect is 138 copies/mL. A negative result does not preclude SARS-Cov-2 infection and should not be used as the sole basis for treatment or other patient management decisions. A negative result may occur with  improper specimen collection/handling, submission of specimen other than nasopharyngeal swab, presence of viral mutation(s) within the areas targeted by this assay, and inadequate number of viral copies(<138 copies/mL). A negative result must be combined with clinical observations, patient history, and epidemiological information. The expected result is Negative.  Fact Sheet for Patients:  EntrepreneurPulse.com.au  Fact Sheet for Healthcare Providers:  IncredibleEmployment.be  This test is no t yet approved or cleared by the [place] FDA and  has been authorized for detection and/or diagnosis of SARS-CoV-2 by FDA under an Emergency Use Authorization (EUA). This EUA will remain  in effect (meaning this test can be used) for the duration of the COVID-19 declaration under Section  564(b)(1) of the Act, 21 U.S.C.section 360bbb-3(b)(1), unless the authorization is terminated  or revoked sooner.       Influenza A by PCR NEGATIVE NEGATIVE Final   Influenza B by PCR NEGATIVE NEGATIVE Final    Comment: (NOTE) The Xpert Xpress SARS-CoV-2/FLU/RSV plus assay is intended as an aid in the diagnosis of influenza from Nasopharyngeal swab specimens and should not be used as a sole basis for treatment. Nasal washings and aspirates are unacceptable for Xpert Xpress SARS-CoV-2/FLU/RSV testing.  Fact Sheet for Patients: EntrepreneurPulse.com.au  Fact Sheet for Healthcare Providers: IncredibleEmployment.be  This test is not yet approved or cleared by the [place] FDA and has been authorized for detection and/or diagnosis of SARS-CoV-2 by FDA under an Emergency Use Authorization (EUA). This EUA  will remain in effect (meaning this test can be used) for the duration of the COVID-19 declaration under Section 564(b)(1) of the Act, 21 U.S.C. section 360bbb-3(b)(1), unless the authorization is terminated or revoked.  Performed at [facility], [address] [place], [place] [address]   Culture, blood (routine x 2)     Status: Abnormal   Collection Time: 05/29/20 10:10 PM   Specimen: BLOOD  Result Value Ref Range Status   Specimen Description   Final    BLOOD RIGHT ANTECUBITAL Performed at [facility], [place] [address], [place], [place] [address]    Special Requests   Final    BOTTLES DRAWN AEROBIC AND ANAEROBIC Blood Culture adequate volume Performed at [facility], [address] [place], [place] [address]    Culture  Setup Time   Final    GRAM POSITIVE COCCI AEROBIC BOTTLE ONLY CRITICAL RESULT CALLED TO, READ BACK BY AND VERIFIED WITH: Ferne Coe PharmD 10:10 05/31/20 ([email or web address])    Culture (A)  Final    STAPHYLOCOCCUS HOMINIS THE SIGNIFICANCE OF ISOLATING THIS ORGANISM FROM A SINGLE SET OF BLOOD  CULTURES WHEN MULTIPLE SETS ARE DRAWN IS UNCERTAIN. PLEASE NOTIFY THE MICROBIOLOGY DEPARTMENT WITHIN ONE WEEK IF SPECIATION AND SENSITIVITIES ARE REQUIRED. Performed at [facility], [place] [address] [place], [place] [address]    Report Status 06/02/2020 FINAL  Final  Blood Culture ID Panel (Reflexed)     Status: Abnormal   Collection Time: 05/29/20 10:10 PM  Result Value Ref Range Status   Enterococcus faecalis NOT DETECTED NOT DETECTED Final   Enterococcus Faecium NOT DETECTED NOT DETECTED Final   Listeria monocytogenes NOT DETECTED NOT DETECTED Final   Staphylococcus species DETECTED (A) NOT DETECTED Final    Comment: CRITICAL RESULT CALLED TO, READ BACK BY AND VERIFIED WITH: Ferne Coe PharmD 10:10 05/31/20 ([email or web address])    Staphylococcus aureus (BCID) NOT DETECTED NOT DETECTED Final   Staphylococcus epidermidis NOT DETECTED NOT DETECTED Final   Staphylococcus lugdunensis NOT DETECTED NOT DETECTED Final   Streptococcus species NOT DETECTED NOT DETECTED Final   Streptococcus agalactiae NOT DETECTED NOT DETECTED Final   Streptococcus pneumoniae NOT DETECTED NOT DETECTED Final   Streptococcus pyogenes NOT DETECTED NOT DETECTED Final   A.calcoaceticus-baumannii NOT DETECTED NOT DETECTED Final   Bacteroides fragilis NOT DETECTED NOT DETECTED Final   Enterobacterales NOT DETECTED NOT DETECTED Final   Enterobacter cloacae complex NOT DETECTED NOT DETECTED Final   Escherichia coli NOT DETECTED NOT DETECTED Final   Klebsiella aerogenes NOT DETECTED NOT DETECTED Final   Klebsiella oxytoca NOT DETECTED NOT DETECTED Final   Klebsiella pneumoniae NOT DETECTED NOT DETECTED Final   Proteus species NOT DETECTED NOT DETECTED Final   Salmonella species NOT DETECTED NOT DETECTED Final   Serratia marcescens NOT DETECTED NOT DETECTED Final   Haemophilus influenzae NOT DETECTED NOT DETECTED Final   Neisseria meningitidis NOT DETECTED NOT DETECTED Final   Pseudomonas aeruginosa NOT DETECTED NOT  DETECTED Final   Stenotrophomonas maltophilia NOT DETECTED NOT DETECTED Final   Candida albicans NOT DETECTED NOT DETECTED Final   Candida auris NOT DETECTED NOT DETECTED Final   Candida glabrata NOT DETECTED NOT DETECTED Final   Candida krusei NOT DETECTED NOT DETECTED Final   Candida parapsilosis NOT DETECTED NOT DETECTED Final   Candida tropicalis NOT DETECTED NOT DETECTED Final   Cryptococcus neoformans/gattii NOT DETECTED NOT DETECTED Final    Comment: Performed at [facility], [address], [place], [place] [address]  Culture, blood (routine x 2)     Status: Abnormal   Collection Time: 05/29/20 10:13 PM   Specimen: BLOOD RIGHT WRIST  Result Value Ref Range Status   Specimen Description   Final    BLOOD RIGHT WRIST Performed at [facility], [address] [place], [place] [address]    Special Requests   Final    BOTTLES DRAWN AEROBIC AND ANAEROBIC Blood Culture results may not be optimal due to an inadequate volume of blood received in culture bottles Performed at [facility], [address] [place], [place] [address]    Culture  Setup Time   Final    GRAM POSITIVE COCCI IN CHAINS IN BOTH AEROBIC AND ANAEROBIC BOTTLES Organism ID to follow CRITICAL RESULT CALLED TO, READ BACK BY AND VERIFIED WITH: Ferne Coe PharmD 10:10 05/31/20 ([email or web address])    Culture (A)  Final    STREPTOCOCCUS MUTANS STREPTOCOCCUS GORDONII THE SIGNIFICANCE OF ISOLATING THIS ORGANISM FROM A SINGLE SET OF BLOOD CULTURES WHEN MULTIPLE SETS ARE DRAWN IS UNCERTAIN. PLEASE NOTIFY THE MICROBIOLOGY DEPARTMENT WITHIN ONE WEEK IF SPECIATION AND SENSITIVITIES ARE REQUIRED. Performed at [facility], [address], [place], [place] [address]    Report Status 06/02/2020 FINAL  Final  Blood Culture ID Panel (Reflexed)     Status: Abnormal   Collection Time: 05/29/20 10:13 PM  Result Value Ref Range Status   Enterococcus faecalis NOT DETECTED NOT DETECTED Final   Enterococcus  Faecium NOT DETECTED NOT DETECTED Final   Listeria monocytogenes NOT DETECTED NOT DETECTED Final   Staphylococcus species NOT DETECTED NOT DETECTED Final   Staphylococcus aureus (BCID) NOT DETECTED NOT DETECTED Final   Staphylococcus epidermidis NOT DETECTED NOT DETECTED Final   Staphylococcus lugdunensis NOT DETECTED NOT DETECTED Final   Streptococcus species DETECTED (A) NOT DETECTED Final    Comment: Not Enterococcus species, Streptococcus agalactiae, Streptococcus pyogenes, or Streptococcus pneumoniae. CRITICAL RESULT CALLED TO, READ BACK BY AND VERIFIED WITH: Ferne Coe PharmD 10:10 05/31/20 ([email or web address])    Streptococcus agalactiae NOT DETECTED NOT DETECTED Final   Streptococcus pneumoniae NOT DETECTED NOT DETECTED Final   Streptococcus pyogenes NOT DETECTED NOT DETECTED Final   A.calcoaceticus-baumannii NOT DETECTED NOT DETECTED Final   Bacteroides fragilis NOT DETECTED NOT DETECTED Final   Enterobacterales NOT DETECTED NOT DETECTED Final   Enterobacter cloacae complex NOT DETECTED NOT DETECTED Final   Escherichia coli NOT DETECTED NOT DETECTED Final   Klebsiella aerogenes NOT DETECTED NOT DETECTED Final   Klebsiella oxytoca NOT DETECTED NOT DETECTED Final   Klebsiella pneumoniae NOT DETECTED NOT DETECTED Final   Proteus species NOT DETECTED NOT DETECTED Final   Salmonella species NOT DETECTED NOT DETECTED Final   Serratia marcescens NOT DETECTED NOT DETECTED Final   Haemophilus influenzae NOT DETECTED NOT DETECTED Final   Neisseria meningitidis NOT DETECTED NOT DETECTED Final   Pseudomonas aeruginosa NOT DETECTED NOT DETECTED Final   Stenotrophomonas maltophilia NOT DETECTED NOT DETECTED Final   Candida albicans NOT DETECTED NOT DETECTED Final   Candida auris NOT DETECTED NOT DETECTED Final   Candida glabrata NOT DETECTED NOT DETECTED Final   Candida krusei NOT DETECTED NOT DETECTED Final   Candida parapsilosis NOT DETECTED NOT DETECTED Final   Candida tropicalis NOT DETECTED NOT  DETECTED Final   Cryptococcus neoformans/gattii NOT DETECTED NOT DETECTED Final    Comment: Performed at [facility], [address], [place], [place] [address]  Culture, blood (routine  x 2)     Status: None (Preliminary result)   Collection Time: 05/31/20  3:05 PM   Specimen: BLOOD  Result Value Ref Range Status   Specimen Description BLOOD RIGHT ANTECUBITAL  Final   Special Requests   Final    BOTTLES DRAWN AEROBIC AND ANAEROBIC Blood Culture adequate volume   Culture   Final    NO GROWTH 2 DAYS Performed at [facility], [address], [place], [place] [address]    Report Status PENDING  Incomplete  Culture, blood (routine x 2)     Status: None (Preliminary result)   Collection Time: 05/31/20  3:13 PM   Specimen: BLOOD RIGHT ARM  Result Value Ref Range Status   Specimen Description BLOOD RIGHT ARM  Final   Special Requests   Final    BOTTLES DRAWN AEROBIC AND ANAEROBIC Blood Culture results may not be optimal due to an inadequate volume of blood received in culture bottles   Culture   Final    NO GROWTH 2 DAYS Performed at [facility], [place] [address], [place], [place] [address]    Report Status PENDING  Incomplete   Time coordinating discharge: 35 minutes  SIGNED:  Kerney Elbe, DO [facility] 06/02/2020, 2:42 PM Pager is on [place]  If 7PM-7AM, please contact night-coverage [email or web address]

## 2020-06-02 NOTE — TOC Transition Note (Addendum)
Transition of Care [facility]) - CM/SW Discharge Note   Patient Details  Name: Keyuna Cuthrell MRN: [id number] Date of Birth: 24-Dec-1927  Transition of Care [facility]) CM/SW Contact:  Angelita Ingles, RN Phone Number: [phone]  06/02/2020, 10:22 AM   Clinical Narrative:    CM spoke with daughter Lonzo Cloud to discuss discharge plans. Dawn has been offered choice for [facility] and is requesting that patient receive RN and [place] aide only. Dawn is currently refusing PT because she states that it causes her mother too much pain. [place] RN and [place] aide has been set up with [facility]. Dawn refuses DME states that she has needed DME at home. Info has been added to avs.No further needs noted at this time.  1450 CM received call from MD requesting palliative referral per families request. CM called daughter Arrie Aran to offer choice. Referral has been made with Venia Carbon @ [facility]. MD also requesting to add speech therapy to [facility] services. CM confirmed with Tommi Rumps at [place] that speech therapy can be added to the patients [place].   Final next level of care: [place] Barriers to Discharge: Continued Medical Work up   Patient Goals and CMS Choice Patient states their goals for this hospitalization and ongoing recovery are:: Daughter states she is just ready to bring her mom home CMS Medicare.gov Compare Post Acute Care list provided to:: Patient Represenative (must comment) (Daughter Dawn) Choice offered to / list presented to : Adult Children  Discharge Placement                       Discharge Plan and Services                DME Arranged: N/A (daughter refused. States she has needed DME at home) DME Agency: NA       HH Arranged: RN,[facility]: [place] Date [place]: 06/02/20 Time [place]: 1022 Representative spoke with at [place]: [place] ([place]) Interventions     Readmission Risk Interventions No  flowsheet data found.

## 2020-06-03 ENCOUNTER — Telehealth: Payer: Self-pay | Admitting: Family

## 2020-06-03 ENCOUNTER — Other Ambulatory Visit: Payer: Self-pay | Admitting: Family

## 2020-06-03 NOTE — Telephone Encounter (Signed)
Please contact pt's daughter to arrange a post hospital follow up visit.

## 2020-06-05 ENCOUNTER — Telehealth: Payer: Self-pay | Admitting: Family

## 2020-06-05 ENCOUNTER — Telehealth: Payer: Self-pay

## 2020-06-05 LAB — CULTURE, BLOOD (ROUTINE X 2)
Culture: NO GROWTH
Culture: NO GROWTH
Special Requests: ADEQUATE

## 2020-06-05 LAB — PATHOLOGIST SMEAR REVIEW

## 2020-06-05 MED ORDER — CLINDAMYCIN HCL 150 MG PO CAPS: 450.0000 mg | ORAL_CAPSULE | Freq: Three times a day (TID) | ORAL | 0 refills | Status: AC

## 2020-06-05 NOTE — Telephone Encounter (Signed)
1st attempt TCM call. No answer. 

## 2020-06-05 NOTE — Telephone Encounter (Signed)
Patient's family advised of new medication and appointment will be scheduled for 06-07-20 at 8:20 am

## 2020-06-05 NOTE — Telephone Encounter (Signed)
In place of augmentin, I have sent an rx for clindamycin to [facility] [place].  I would like to see her for a follow up visit before I leave for vacation- let's see if we can get her in on Wednesday please.

## 2020-06-05 NOTE — Telephone Encounter (Signed)
Per patient daughter, patient was releases from the hospital on Friday with an antibiotic of amoxicillin-clavulanate (AUGMENTIN) 875-125 MG tablet [[id number] Patient is allergic to this medication and family is asking that Melissa send another anitbiotic to her local pharmacy   Please Advise

## 2020-06-06 ENCOUNTER — Telehealth: Payer: Self-pay

## 2020-06-06 NOTE — Telephone Encounter (Signed)
Transition Care Management Follow-up Telephone Call  Date of discharge and from where: 06/02/20-[place]  How have you been since you were released from the hospital? [facility] but very weak(per caregiver)  Any questions or concerns? No  Items Reviewed:  Did the pt receive and understand the discharge instructions provided? Yes   Medications obtained and verified? Yes   Other? Yes   Any new allergies since your discharge? No   Dietary orders reviewed? Yes  Do you have support at home? Yes   Home Care and Equipment/Supplies: Were home health services ordered? yes If so, what is the name of the agency? [facility]  Has the agency set up a time to come to the patient's home? yes Were any new equipment or medical supplies ordered?  No What is the name of the medical supply agency? n/a Were you able to get the supplies/equipment? not applicable Do you have any questions related to the use of the equipment or supplies? N/A  Functional Questionnaire: (I = Independent and D = Dependent) ADLs: D  Bathing/Dressing- D  Meal Prep- D  Eating- D  Maintaining continence- D  Transferring/Ambulation- D  Managing Meds- D  Follow up appointments reviewed:   PCP Hospital f/u appt confirmed? Yes  Scheduled to see Joan Hill on 06/07/20 @ 8:20am.  [place] [facility] f/u appt confirmed? N/A   Are transportation arrangements needed? No   If their condition worsens, is the pt aware to call PCP or go to the Emergency Dept.? Yes  Was the patient provided with contact information for the PCP's office or ED? Yes  Was to pt encouraged to call back with questions or concerns? Yes

## 2020-06-06 NOTE — Telephone Encounter (Signed)
Spoke with patients daughter Arrie Aran and scheduled an in-person Palliative Consult for 06/14/20 @ 8:30AM   COVID screening was negative. One little dog in the home, will put up before NP arrives.  Patient lives with daughter and son in law.    Consent obtained; updated Outlook/Netsmart/Team List and Epic.

## 2020-06-07 ENCOUNTER — Other Ambulatory Visit: Payer: Self-pay

## 2020-06-07 ENCOUNTER — Ambulatory Visit (INDEPENDENT_AMBULATORY_CARE_PROVIDER_SITE_OTHER): Payer: Medicare Other | Admitting: Family

## 2020-06-07 ENCOUNTER — Telehealth: Payer: Self-pay | Admitting: Family

## 2020-06-07 VITALS — BP 133/65 | HR 100 | Temp 97.4°F | Resp 17

## 2020-06-07 DIAGNOSIS — R197 Diarrhea, unspecified: Secondary | ICD-10-CM

## 2020-06-07 DIAGNOSIS — J690 Pneumonitis due to inhalation of food and vomit: Secondary | ICD-10-CM | POA: Diagnosis not present

## 2020-06-07 DIAGNOSIS — F0390 Unspecified dementia without behavioral disturbance: Secondary | ICD-10-CM | POA: Diagnosis not present

## 2020-06-07 NOTE — Telephone Encounter (Signed)
Caller: [facility]) Call back # [phone]  Would like to know if you be the attending doctor while in hospice

## 2020-06-07 NOTE — Addendum Note (Signed)
Addended by: Kelle Darting A on: 06/07/2020 03:58 PM   Modules accepted: Orders

## 2020-06-07 NOTE — Progress Notes (Signed)
Subjective:    Patient ID: Joan Hill, female    DOB: April 03, 1928, 86 y.o.   MRN: [id number]  HPI  Patient is a 86 yr old female who presents today for hospital follow up. She was admitted 12/13-12/17 with RLL/RML pneumonia and pleural effusion.  Family declined thoracentesis and she was diuresed with a dose of IV lasix. She was treated with IV abx in the hospital which was transitioned to PO antibiotics. She was initially sent home with an rx for augmentin, however she has a penicillin allergy and we instead changed her to clindamycin.  Daughter notes that she has had trouble taking 9 caps/day of the clindamycin and has recently been experiencing watery stools.    Daughter who presents today is also her 64 and states that the family would prefer hospice evaluation rather than palliative care consult and that they would like to transition to comfort care measures only.   Patient denies any pain today.  She has been tolerating PO's.    Review of Systems See HPI  Past Medical History:  Diagnosis Date  . Breast cancer ([place])   . Chronic low back pain   . CLL (chronic lymphocytic leukemia) ([place])   . Dehydration   . Hypertension   . Memory loss   . Sacral fracture ([place]) 03/2019     Social History   Socioeconomic History  . Marital status: Widowed    Spouse name: Not on file  . Number of children: Not on file  . Years of education: Not on file  . Highest education level: Not on file  Occupational History  . Not on file  Tobacco Use  . Smoking status: Never Smoker  . Smokeless tobacco: Never Used  Vaping Use  . Vaping Use: Never used  Substance and Sexual Activity  . Alcohol use: Not Currently  . Drug use: Never  . Sexual activity: Not Currently  Other Topics Concern  . Not on file  Social History Narrative   Lives with daughter and son in law   Has 2 daughters and 2 sons, oldest son is deceased, other children live locally.    Social Determinants of Health    Financial Resource Strain: Not on file  Food Insecurity: Not on file  Transportation Needs: Not on file  Physical Activity: Not on file  Stress: Not on file  Social Connections: Not on file  Intimate Partner Violence: Not on file    Past Surgical History:  Procedure Laterality Date  . MASTECTOMY      No family history on file.  Allergies  Allergen Reactions  . Amoxicillin     Unknown    Current Outpatient Medications on File Prior to Visit  Medication Sig Dispense Refill  . acetaminophen (TYLENOL) 325 MG tablet Take 2 tablets (650 mg total) by mouth every 6 (six) hours as needed for mild pain (or Fever >/= 101). 30 tablet 0  . clindamycin (CLEOCIN) 150 MG capsule Take 3 capsules (450 mg total) by mouth 3 (three) times daily for 7 days. 63 capsule 0  . donepezil (ARICEPT) 5 MG tablet Take 1 tablet (5 mg total) by mouth at bedtime. 30 tablet 5  . ondansetron (ZOFRAN) 4 MG tablet Take 1 tablet (4 mg total) by mouth every 6 (six) hours as needed for nausea. 20 tablet 0  . polyethylene glycol (MIRALAX / GLYCOLAX) 17 g packet Take 17 g by mouth daily as needed for mild constipation. 14 each 0  . polyethylene glycol powder (  GLYCOLAX/MIRALAX) 17 GM/SCOOP powder SMARTSIG:1 Capful(s) By Mouth Daily PRN    . traMADol (ULTRAM) 50 MG tablet TAKE 2 TABLETS BY MOUTH 2 TIMES DAILY. 120 tablet 0  . Zoster Vaccine Adjuvanted [facility]) injection Inject 0.5mg  IM now and again in 2-6 months. 0.5 mL 1   No current facility-administered medications on file prior to visit.    BP 133/65 (BP Location: Right Arm, Patient Position: Sitting, Cuff Size: Normal)   Pulse 100   Temp (!) 97.4 F (36.3 C) (Temporal)   Resp 17   SpO2 98%       Objective:   Physical Exam Constitutional:      Comments: Sleeping in wheel chair.  Confused but arouseable and pleasant.   Cardiovascular:     Rate and Rhythm: Normal rate and regular rhythm.  Pulmonary:     Breath sounds: Examination of the right-lower  field reveals decreased breath sounds. Examination of the left-lower field reveals rales. Decreased breath sounds and rales present. No wheezing.  Musculoskeletal:        General: No swelling.  Skin:    General: Skin is warm and dry.           Assessment & Plan:  End stage dementia/Aspiration pneumonia- Patient's family is requesting home hospice, comfort measures.  Confirmed DNR status with daughter who is HCPOA and filled the gold out of hospital DNR form. Daughter was advised to place form on the refrigerator. They do not wish to place her on a dysphagia diet, but would prefer regular diet for comfort.  They do not wish to do any further blood work or x-rays and would like for patient to remain at home and avoid any re-hospitalization.  At this point- she has completed a 10 day course of abx and is not tolerating well due to diarrhea. I advised daughter- ok to d/c abx at this point as we have likely reached maximal benefit.   Diarrhea- new. ? Side effect to abx versus c diff. Will obtain stool for c. Diff and culture. Will plan to treat if + for c diff as this would make her more comfortable.  Daughter is in agreement.   40 minutes spent on today's visit.  The majority of this time was spent reviewing the hospital record and counseling patient/family.  This visit occurred during the SARS-CoV-2 public health emergency.  Safety protocols were in place, including screening questions prior to the visit, additional usage of staff PPE, and extensive cleaning of exam room while observing appropriate contact time as indicated for disinfecting solutions.

## 2020-06-07 NOTE — Patient Instructions (Signed)
Please complete stool kit and return at your earliest convenience.  You should be contacted about your home hospice referral.

## 2020-06-07 NOTE — Telephone Encounter (Signed)
Left detail message for Joan Hill to be aware Joan Hill will be the patient's attending provider during hospice care.

## 2020-06-14 ENCOUNTER — Other Ambulatory Visit: Payer: Medicare Other | Admitting: Internal Medicine

## 2020-06-19 ENCOUNTER — Ambulatory Visit: Payer: Medicare Other | Admitting: Family

## 2020-06-23 ENCOUNTER — Ambulatory Visit: Payer: Medicare Other | Admitting: Family

## 2020-06-27 ENCOUNTER — Other Ambulatory Visit: Payer: Self-pay | Admitting: Family

## 2020-08-05 ENCOUNTER — Other Ambulatory Visit: Payer: Self-pay | Admitting: Family

## 2020-08-07 NOTE — Telephone Encounter (Addendum)
Requesting:Tramadol  Contract:05/18/20 UDS:03/21/20 Last Visit:06/07/20 Next Visit:None  Last Refill:06/28/20  Please Advise

## 2020-09-03 ENCOUNTER — Other Ambulatory Visit: Payer: Self-pay | Admitting: Family

## 2020-09-04 NOTE — Telephone Encounter (Signed)
Requesting: tramadol 50mg  Contract: 04/21/2019 UDS: 03/21/2020 Last Visit: 06/07/2020 Next Visit: None Last Refill: 08/07/2020 #120 and 0RF  Please Advise

## 2020-10-08 ENCOUNTER — Other Ambulatory Visit: Payer: Self-pay | Admitting: Family

## 2021-02-23 ENCOUNTER — Other Ambulatory Visit: Payer: Self-pay | Admitting: Family

## 2021-02-24 ENCOUNTER — Telehealth: Payer: Self-pay | Admitting: Family

## 2021-02-24 NOTE — Telephone Encounter (Signed)
Patient is past due for follow up visit.  Please call daughter to arrange.

## 2021-02-26 NOTE — Telephone Encounter (Signed)
Patient is scheduled for virtual visit 03-06-21

## 2021-03-06 ENCOUNTER — Ambulatory Visit: Payer: Medicare Other

## 2021-03-06 NOTE — Progress Notes (Deleted)
Subjective:   Joan Hill is a 87 y.o. female who presents for an Initial Medicare Annual Wellness Visit.  I connected with  *** today by a video enabled telemedicine application and verified that I am speaking with the correct person using two identifiers.  Location of patient:*** Location of provider:Work  Persons participating in the virtual visit: patient, nurse.   I discussed the limitations, risk, security and privacy concerns of evaluation and management by telemedicine. The patient expressed understanding and agreed to proceed.  Some vital signs may be absent or patient reported.   Review of Systems    ***       Objective:    There were no vitals filed for this visit. There is no height or weight on file to calculate BMI.  Advanced Directives 05/31/2020 05/29/2020 04/08/2019  Does Patient Have a Medical Advance Directive? Yes Yes Yes  Type of Paramedic of [place];Living will [place];Living will [place];Living will  Does patient want to make changes to medical advance directive? No - Patient declined - -  Copy of [place] in Chart? No - copy requested - -    Current Medications (verified) Outpatient Encounter Medications as of 03/06/2021  Medication Sig   acetaminophen (TYLENOL) 325 MG tablet TAKE 2 TABLETS (650 MG TOTAL) BY MOUTH EVERY SIX HOURS AS NEEDED FOR MILD PAIN (OR FEVER >/= 101).   donepezil (ARICEPT) 5 MG tablet Take 1 tablet (5 mg total) by mouth at bedtime.   ondansetron (ZOFRAN) 4 MG tablet TAKE 1 TABLET (4 MG TOTAL) BY MOUTH EVERY SIX HOURS AS NEEDED FOR NAUSEA.   polyethylene glycol (MIRALAX / GLYCOLAX) 17 g packet Take 17 g by mouth daily as needed for mild constipation.   polyethylene glycol powder (GLYCOLAX/MIRALAX) 17 GM/SCOOP powder SMARTSIG:1 Capful(s) By Mouth Daily PRN   polyethylene glycol powder (GLYCOLAX/MIRALAX) 17 GM/SCOOP powder DISSOLVE 1 CAPFUL  (17 G) IN WATER AND DRINK DAILY AS NEEDED FOR MILD CONSTIPATION.   traMADol (ULTRAM) 50 MG tablet TAKE 2 TABLETS BY MOUTH TWICE A DAY   Zoster Vaccine Adjuvanted [facility]) injection Inject 0.5mg  IM now and again in 2-6 months.   No facility-administered encounter medications on file as of 03/06/2021.    Allergies (verified) Amoxicillin   History: Past Medical History:  Diagnosis Date   Breast cancer ([place])    Chronic low back pain    CLL (chronic lymphocytic leukemia) ([facility])    Dehydration    Hypertension    Memory loss    Sacral fracture ([place]) 03/2019   Past Surgical History:  Procedure Laterality Date   MASTECTOMY     No family history on file. Social History   Socioeconomic History   Marital status: Widowed    Spouse name: Not on file   Number of children: Not on file   Years of education: Not on file   Highest education level: Not on file  Occupational History   Not on file  Tobacco Use   Smoking status: Never   Smokeless tobacco: Never  Vaping Use   Vaping Use: Never used  Substance and Sexual Activity   Alcohol use: Not Currently   Drug use: Never   Sexual activity: Not Currently  Other Topics Concern   Not on file  Social History Narrative   Lives with daughter and son in law   Has 2 daughters and 2 sons, oldest son is deceased, other children live locally.  Social Determinants of Health   Financial Resource Strain: Not on file  Food Insecurity: Not on file  Transportation Needs: Not on file  Physical Activity: Not on file  Stress: Not on file  Social Connections: Not on file    Tobacco Counseling Counseling given: Not Answered   Clinical Intake:                 Diabetic?***         Activities of Daily Living In your present state of health, do you have any difficulty performing the following activities: 05/31/2020  Hearing? N  Vision? N  Difficulty concentrating or making decisions? Y  Walking or climbing stairs? Y   Dressing or bathing? Y  Doing errands, shopping? Y  Some recent data might be hidden    Patient Care Team: Debbrah Alar, NP as PCP - General (Internal Medicine)  Indicate any recent Medical Services you may have received from other than [facility] providers in the past year (date may be approximate).     Assessment:   This is a routine wellness examination for [facility].  Hearing/Vision screen No results found.  Dietary issues and exercise activities discussed:     Goals Addressed   None    Depression Screen PHQ 2/9 Scores 12/14/2019  PHQ - 2 Score 0    Fall Risk No flowsheet data found.  FALL RISK PREVENTION PERTAINING TO THE HOME:  Any stairs in or around the home? {YES/NO:21197} If so, are there any without handrails? {YES/NO:21197} Home free of loose throw rugs in walkways, pet beds, electrical cords, etc? {YES/NO:21197} Adequate lighting in your home to reduce risk of falls? {YES/NO:21197}  ASSISTIVE DEVICES UTILIZED TO PREVENT FALLS:  Life alert? {YES/NO:21197} Use of a cane, walker or w/c? {YES/NO:21197} Grab bars in the bathroom? {YES/NO:21197} Shower chair or bench in shower? {YES/NO:21197} Elevated toilet seat or a handicapped toilet? {YES/NO:21197}  TIMED UP AND GO:  Was the test performed? {YES/NO:21197}.  Length of time to ambulate 10 feet: *** sec.   {Appearance of BSWH:6759163}  Cognitive Function:        Immunizations Immunization History  Administered Date(s) Administered   Fluad Quad(high Dose 65+) 02/15/2019, 03/21/2020   PFIZER(Purple Top)SARS-COV-2 Vaccination 11/04/2019, 11/25/2019   Pneumococcal Conjugate-13 01/05/2014   Pneumococcal Polysaccharide-23 04/29/2005   Td 01/22/2010    {TDAP status:2101805}  {Flu Vaccine status:2101806}  {Pneumococcal vaccine status:2101807}  {Covid-19 vaccine status:2101808}  Qualifies for Shingles Vaccine? {YES/NO:21197}  Zostavax completed {YES/NO:21197}  {Shingrix  Completed?:2101804}  Screening Tests Health Maintenance  Topic Date Due   Zoster Vaccines- Shingrix (1 of 2) Never done   DEXA SCAN  Never done   COVID-19 Vaccine (3 - Pfizer risk series) 12/23/2019   TETANUS/TDAP  01/23/2020   INFLUENZA VACCINE  01/15/2021   HPV VACCINES  Aged Out    Health Maintenance  Health Maintenance Due  Topic Date Due   Zoster Vaccines- Shingrix (1 of 2) Never done   DEXA SCAN  Never done   COVID-19 Vaccine (3 - Pfizer risk series) 12/23/2019   TETANUS/TDAP  01/23/2020   INFLUENZA VACCINE  01/15/2021    {Colorectal cancer screening:2101809}  {Mammogram status:21018020}  {Bone Density status:21018021}  Lung Cancer Screening: (Low Dose CT Chest recommended if Age 98-80 years, 30 pack-year currently smoking OR have quit w/in 15years.) {DOES NOT does:27190::"does not"} qualify.   Lung Cancer Screening Referral: ***  Additional Screening:  Hepatitis C Screening: {DOES NOT does:27190::"does not"} qualify; Completed ***  Vision Screening: Recommended annual ophthalmology  exams for early detection of glaucoma and other disorders of the eye. Is the patient up to date with their annual eye exam?  {YES/NO:21197} Who is the provider or what is the name of the office in which the patient attends annual eye exams? *** If pt is not established with a provider, would they like to be referred to a provider to establish care? {YES/NO:21197}.   Dental Screening: Recommended annual dental exams for proper oral hygiene  Community Resource Referral / Chronic Care Management: CRR required this visit?  {YES/NO:21197}  CCM required this visit?  {YES/NO:21197}     Plan:     I have personally reviewed and noted the following in the patient's chart:   Medical and social history Use of alcohol, tobacco or illicit drugs  Current medications and supplements including opioid prescriptions. {Opioid [phone]} Functional ability and  status Nutritional status Physical activity Advanced directives List of other physicians Hospitalizations, surgeries, and ER visits in previous 12 months Vitals Screenings to include cognitive, depression, and falls Referrals and appointments  In addition, I have reviewed and discussed with patient certain preventive protocols, quality metrics, and best practice recommendations. A written personalized care plan for preventive services as well as general preventive health recommendations were provided to patient.     Marta Antu, LPN   3/87/5643   Nurse Notes: ***

## 2021-07-02 ENCOUNTER — Telehealth: Payer: Self-pay

## 2021-07-02 NOTE — Telephone Encounter (Signed)
Nurse Assessment Nurse: Gildardo Pounds, RN, Joan Hill Date/Time Eilene Ghazi Time): 06/30/2021 9:43:05 AM Confirm and document reason for call. If symptomatic, describe symptoms. ---Caller state she is Joan Hill with [facility]. The patient is not in the [facility] system, so she needs to know if she is in the system, maybe under a different name or a facility. She needs to know if the NP will sign the death certificate for pt. Pt. passed yesterday, 06/29/2020. Does the patient have any new or worsening symptoms? ---No Disp. Time Eilene Ghazi Time) Disposition Final User 06/30/2021 9:42:05 AM Send to Urgent Mikael Spray 06/30/2021 9:48:47 AM Paged On Call back to [facility], [place], Joan Hill 06/30/2021 10:21:51 AM Paged On Call back to [facility], [place], Joan Hill 06/30/2021 10:45:55 AM Paged On Call back to [facility], [place], Joan Hill 06/30/2021 12:38:37 PM Called On-Call Provider [place], [place], [place] 06/30/2021 12:41:24 PM Paged On Call back to [facility], [place], [place] 06/30/2021 11:09:28 AM Clinical Call Yes [place], RN, Joan Hill Comments User: Wayne Sever, RN Date/Time Eilene Ghazi Time): 06/30/2021 10:22:33 AM PLEASE NOTE: All timestamps contained within this report are represented as [place] Standard Time. CONFIDENTIALTY NOTICE: This fax transmission is intended only for the addressee. It contains information that is legally privileged, confidential or otherwise protected from use or disclosure. If you are not the intended recipient, you are strictly prohibited from reviewing, disclosing, copying using or disseminating any of this information or taking any action in reliance on or regarding this information. If you have received this fax in error, please notify us immediately by telephone so that we can arrange for its return to [place]. Phone: [phone], Toll-Free: [phone], Fax: [phone] Page: 2 of 2 Call Id: [id number] Comments Dr. Suzan Garibaldi mailbox is full. Unable to leave a  message at secondary contact number. User: Wayne Sever, RN Date/Time Eilene Ghazi Time): 06/30/2021 11:09:19 AM Notified caller that the On Call has not responded yet, but will notify her if they do. Also informed her a report would be sent to the office. User: Wayne Sever, RN Date/Time Eilene Ghazi Time): 06/30/2021 12:31:24 PM Dr. Josefa Half called back. Notified her of caller's situation & need for death certificate to be signed. She stated the PCP will sign it. User: Wayne Sever, RN Date/Time Eilene Ghazi Time): 06/30/2021 12:34:56 PM Notified caller that the PCP would sign the death certificate. She states she needs to know if the PCP is in the [place] system, if she has credentials for the [place] system. User: Wayne Sever, RN Date/Time Eilene Ghazi Time): 06/30/2021 12:59:00 PM Dr. Diona Browner called back to say that she has texted & called Melissa, the pt's PCP, & has not gotten a response, so the funeral home will probably have to wait until Monday. User: Wayne Sever, RN Date/Time Eilene Ghazi Time): 06/30/2021 1:00:41 PM Notified Jonathon at the funeral home of Dr. Rometta Emery instructions. He is going to let Manuela Schwartz know. Paging DoctorName Phone DateTime Result/ Outcome Message Type Notes Eliezer Lofts - MD [phone] 06/30/2021 9:48:47 AM Paged On Call Back to Call Center Doctor Paged Please call Joan Hill at Access Nurse at [phone]. Eliezer Lofts - MD [phone] 06/30/2021 10:21:51 AM Called on Call provider - No message left Doctor Paged Eliezer Lofts - MD [phone] 06/30/2021 10:45:55 AM Paged On Call Back to Call Center Doctor Paged Please call Joan Hill at Access Nurse at [phone]. Eliezer Lofts - MD 06/30/2021 11:07:45 AM Unable to Reach on call - Max Attempts Message Result Eliezer Lofts - MD [phone] 06/30/2021 12:38:37 PM Called On Call  Provider - Reached Doctor Paged Eliezer Lofts - MD 06/30/2021 12:41:07 PM Spoke with On Call - General Message Result Notified Dr. Diona Browner  that the funeral home is not finding the PCP's name in the [place] system. They need to know if she is under a different name or facility. She is going to try to contact the PCP to find out. She states all the providers have had the [place] training, so she should be in the system

## 2021-07-02 NOTE — Telephone Encounter (Signed)
Spoke with Amy at [facility] and she was able to locate [place] in the [place] portal and will attach [place] as the PCP to the certificate.

## 2021-07-02 NOTE — Telephone Encounter (Signed)
Patient passed and death cert needs signature  funeral home is not finding the PCP's name in the [place] system. They need to know if she is under a different name or facility. She is going to try to contact the PCP to find out. She states all the providers have had the [facility] training, so she should be in the system Nino Parsley with [facility]

## 2021-07-03 NOTE — Telephone Encounter (Signed)
Called [facility] and they are "working on paitent's paperwork and will try to get it to [place] this afternoon"

## 2021-07-03 NOTE — Telephone Encounter (Signed)
Please call [employer] home and let them know that I checked [place] and I do not see an entry for this patient.  Have they started the death certificate?

## 2021-07-04 ENCOUNTER — Telehealth: Payer: Self-pay | Admitting: Family

## 2021-07-04 NOTE — Telephone Encounter (Signed)
Spoke to son-in-law and he told me that patient died peacefully in her sleep.  I completed pt death certificate in [place].

## 2021-07-14 IMAGING — CT CT HEAD W/O CM
3 series · 14 of 47 positions shown, 16 images · non-contrast
Comparison: MRI 06/18/2017

CLINICAL DATA: Acute neurologic deficit, stroke, hypertension,
breast cancer

EXAM:
CT HEAD WITHOUT CONTRAST
TECHNIQUE: Contiguous axial images were obtained from the base of the skull
through the vertex without intravenous contrast.

[Series 2: head wo · axial · 0.45mm/px · z∈[+1097,+1222]mm · 8 of 31 slices shown, 10 images]
[im 3/31  brain]
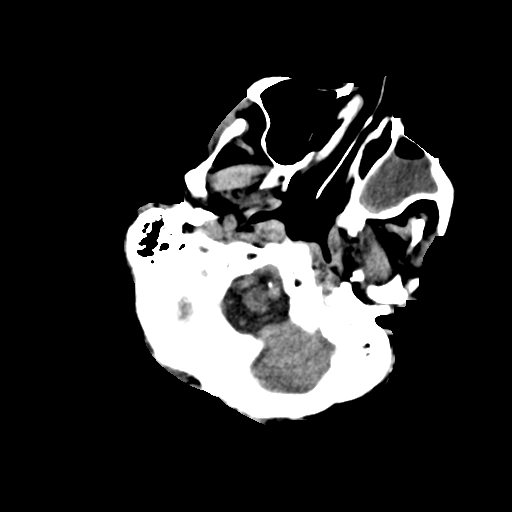
[im 3/31  bone]
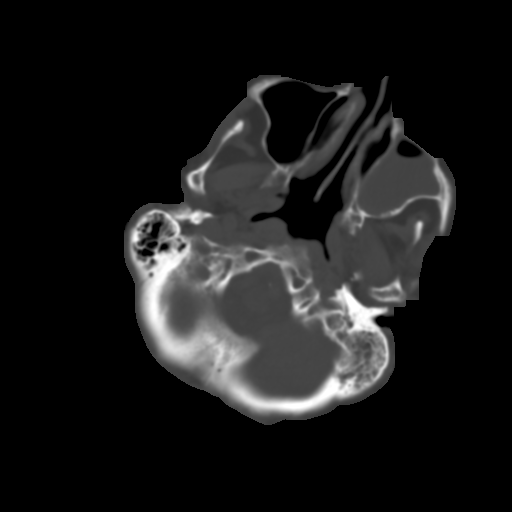
[im 7/31  brain]
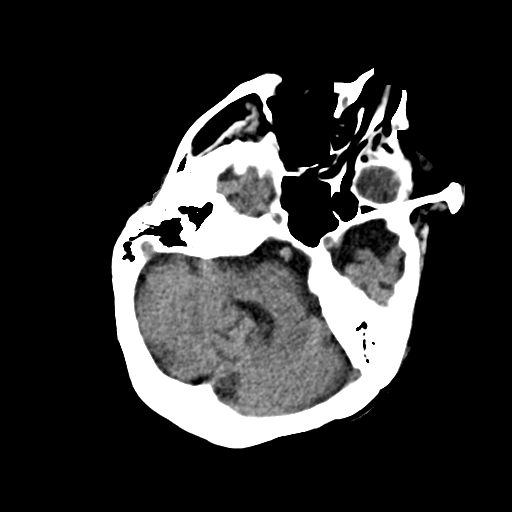
[im 10/31  brain]
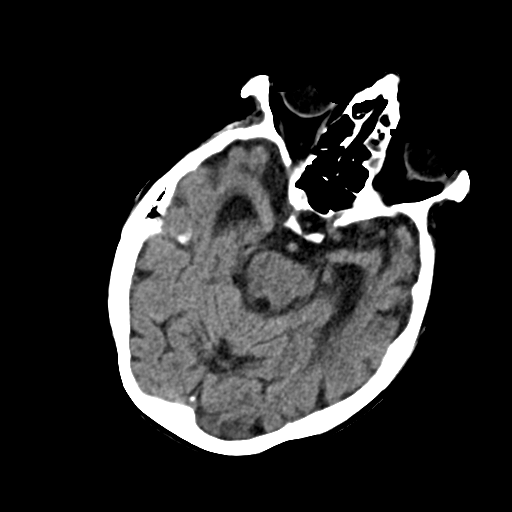
[im 14/31  brain]
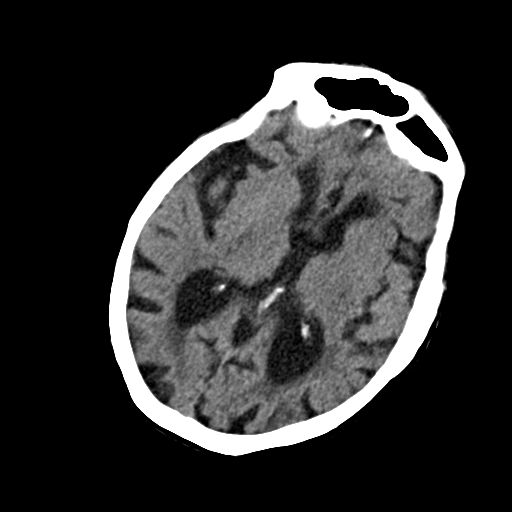
[im 17/31  brain]
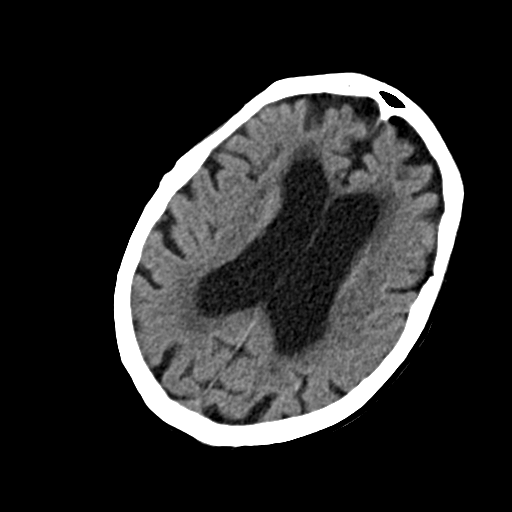
[im 17/31  bone]
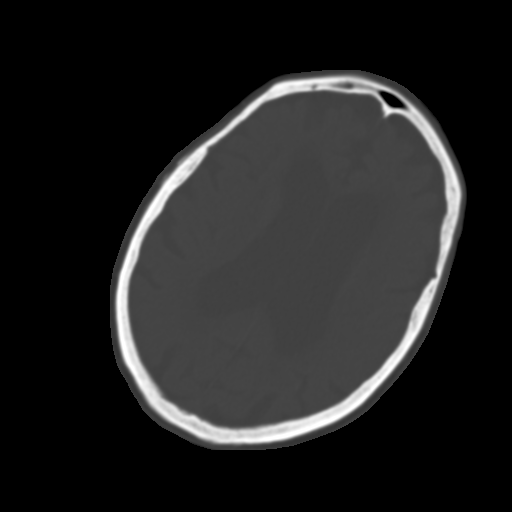
[im 21/31  brain]
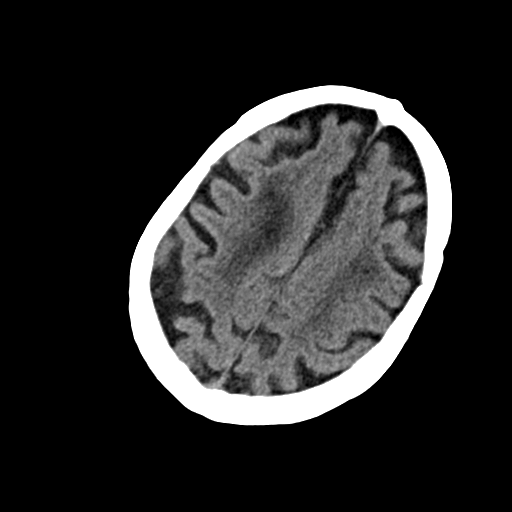
[im 24/31  brain]
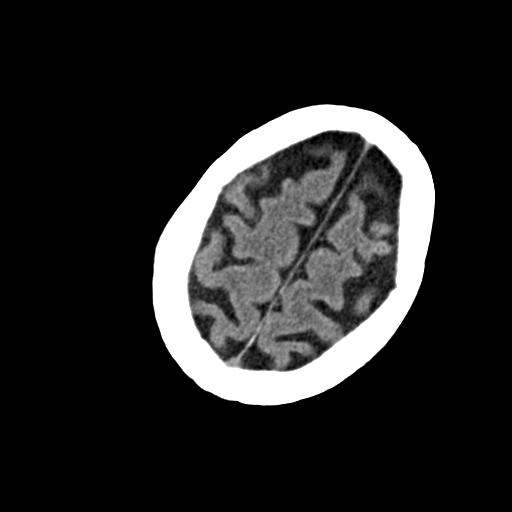
[im 28/31  brain]
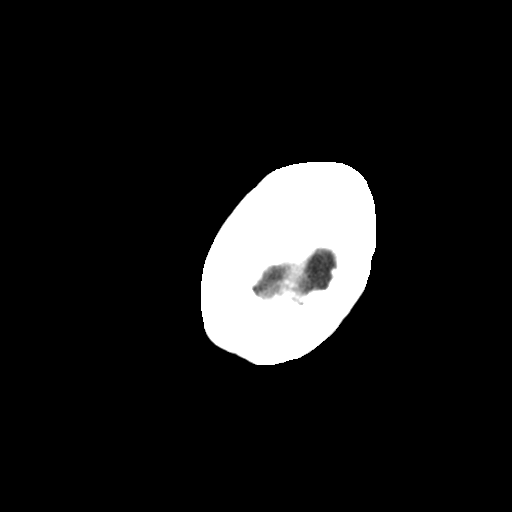

[Series 4: coronal soft · coronal · 0.30mm/px · 3 of 70 slices shown]
[im 24/70  brain]
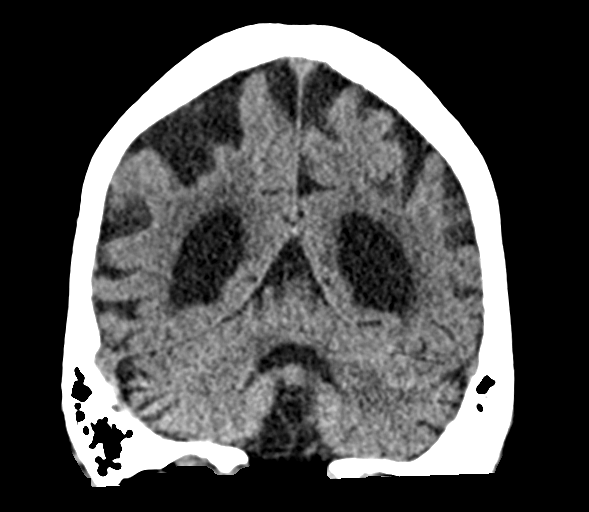
[im 31/70  brain]
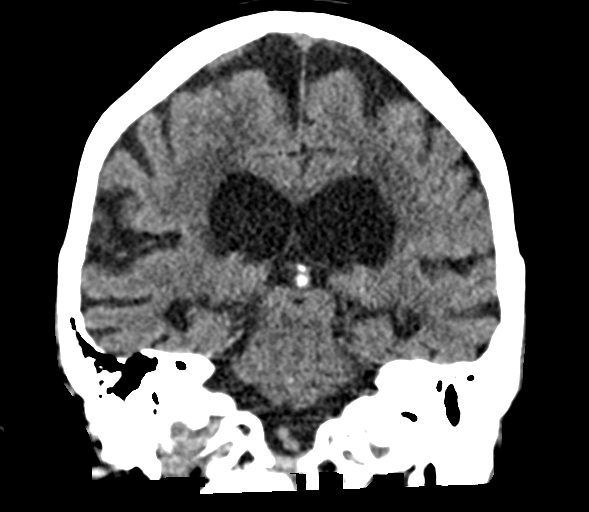
[im 39/70  brain]
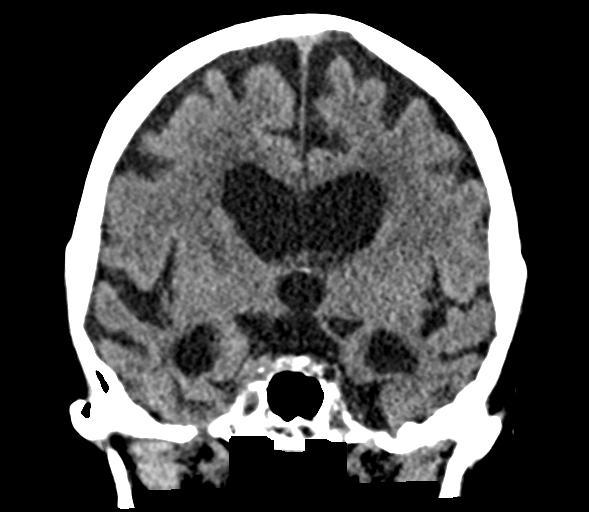

[Series 5: sag soft · sagittal · 0.30mm/px · 3 of 55 slices shown]
[im 19/55  brain]
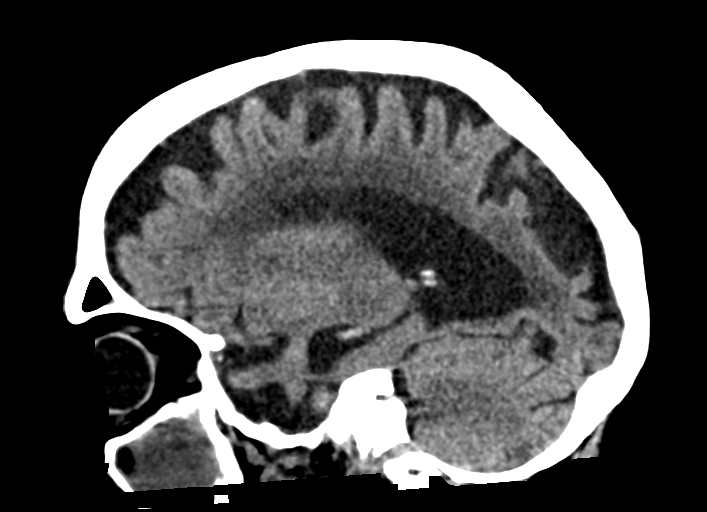
[im 28/55  brain]
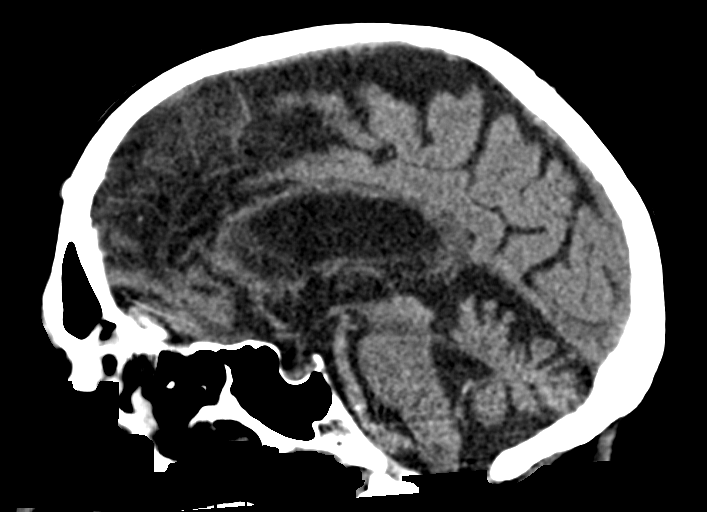
[im 37/55  brain]
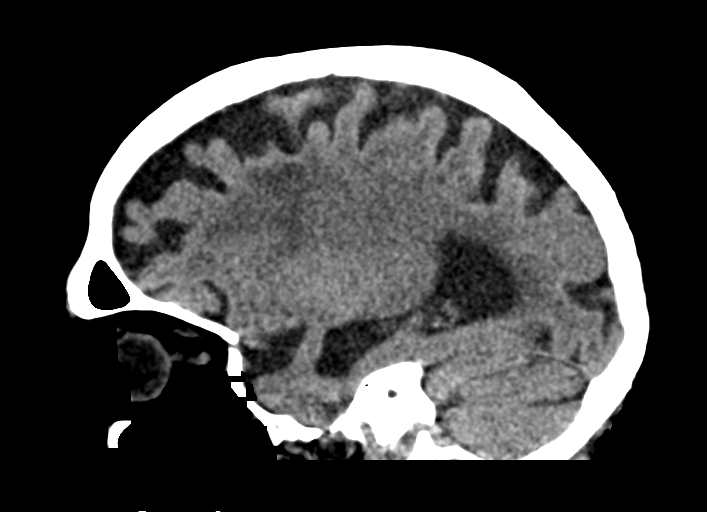

[14 of 47 positions shown; findings below may reference images not displayed]

FINDINGS: Brain: Normal anatomic configuration. Moderate parenchymal volume
loss is commensurate with the patient's age. The degree of
parenchymal volume loss has progressed since prior examination,
however. Mild periventricular white matter changes are present
likely reflecting the sequela of small vessel ischemia. Tiny remote
infarct noted within the right cerebellar hemisphere no abnormal
intra or extra-axial mass lesion or fluid collection. No abnormal
mass effect or midline shift. No evidence of acute intracranial
hemorrhage or infarct. The ventricles are mildly dilated, stable
since prior examination and commensurate with the degree of
parenchymal volume loss. Cerebellum unremarkable.

Vascular: No asymmetric hyperdense vasculature at the skull base.

Skull: Intact

Sinuses/Orbits: There is dense opacification of the left maxillary
sinus as well as multiple left ethmoid air cells. Mild mucosal
thickening is seen within the frontal sinuses, left greater than
right. The orbits are unremarkable.

Other: There is fluid opacification of the left mastoid air cells
without associated osseous erosion, similar to that noted on prior
examination. Right mastoid air cells are clear. Middle ear cavities
are clear.
IMPRESSION: No evidence of acute intracranial hemorrhage or infarct.

Advanced senescent changes with progressive parenchymal atrophy
since prior examination. Mild ventriculomegaly is stable and likely
reflects the sequela of central atrophy.

Stable left mastoid effusion.

Extensive predominantly left-sided paranasal sinus disease with near
complete opacification of the left maxillary sinus.

## 2021-07-17 IMAGING — DX DG CHEST 1V PORT
1 series · 1 of 1 positions shown · non-contrast
Comparison: May 29, 2020.

CLINICAL DATA: Dyspnea.

EXAM:
PORTABLE CHEST 1 VIEW

[chest ap]
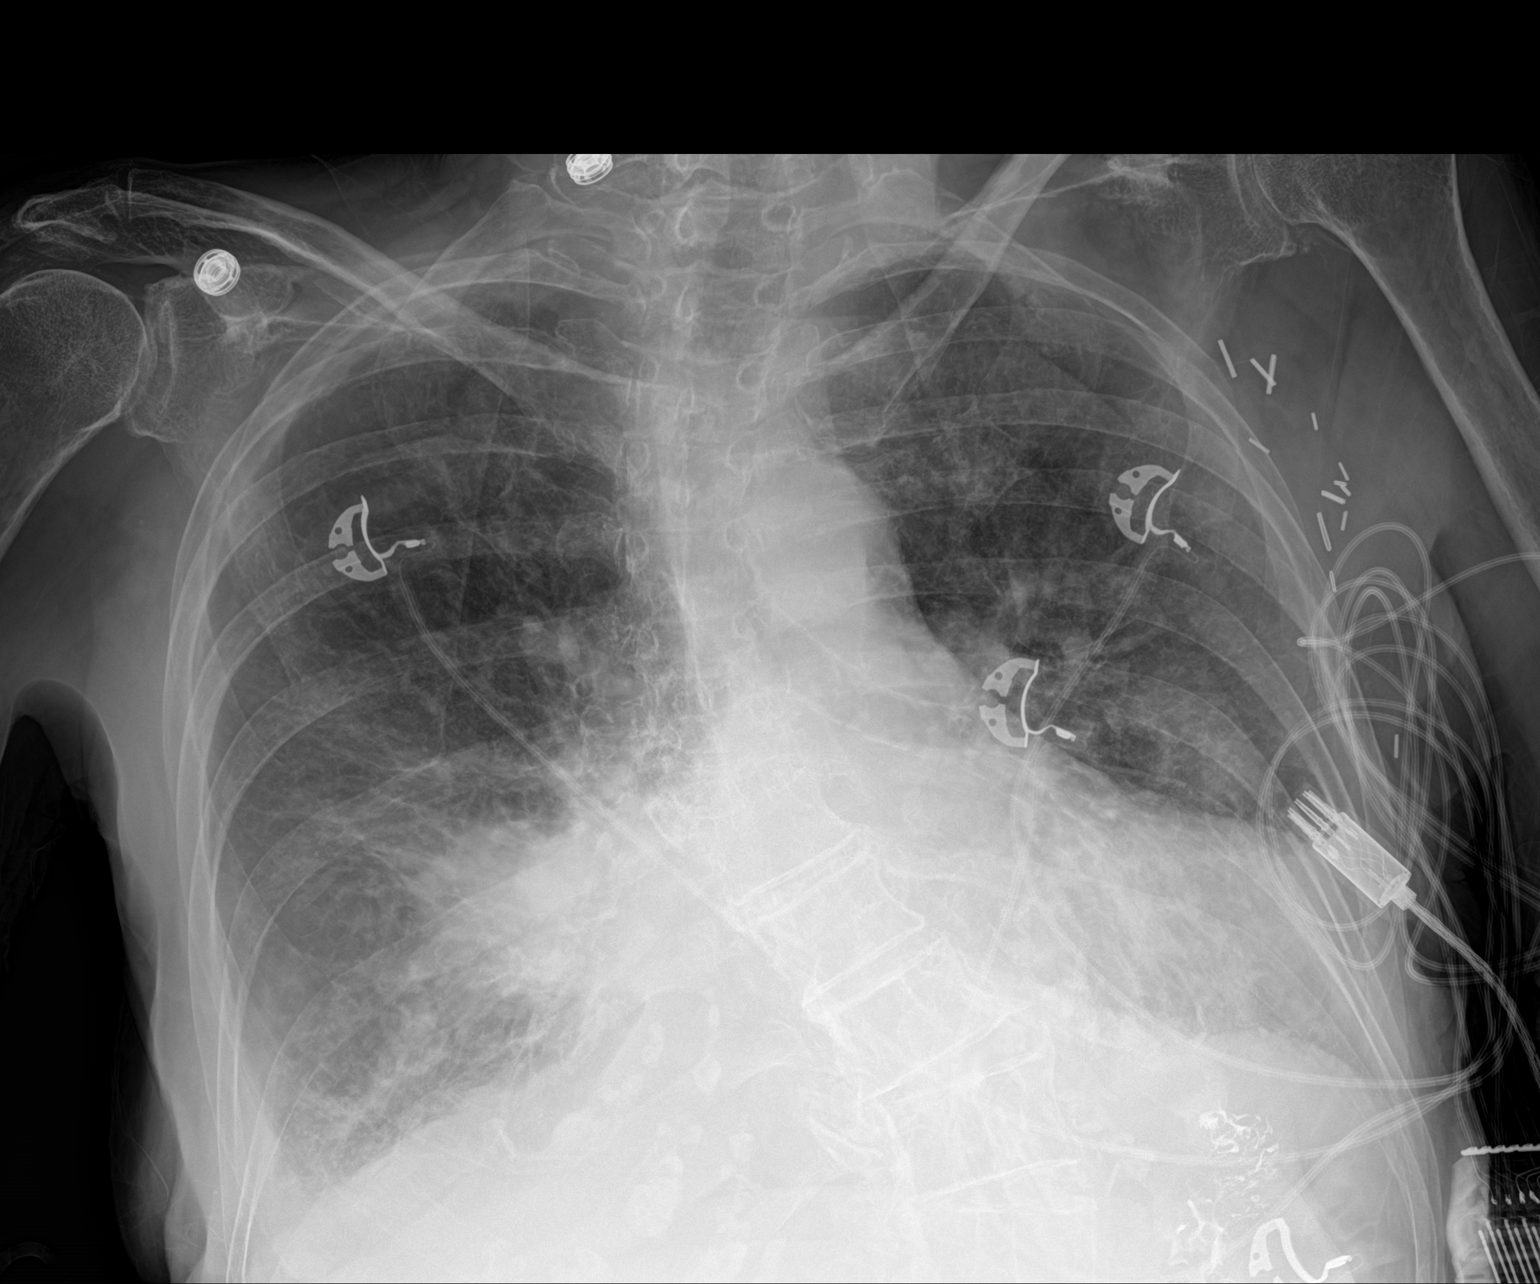

[1 of 1 positions shown; findings below may reference images not displayed]

FINDINGS: Stable cardiomediastinal silhouette. No pneumothorax is noted.
Rounded density is seen in right infrahilar region concerning for
possible mass or neoplasm. Bibasilar atelectasis or edema is noted
with possible small right pleural effusion. Bony thorax is
unremarkable.
IMPRESSION: Rounded density seen in right infrahilar region concerning for
possible mass or neoplasm. Bibasilar atelectasis or edema is noted
with possible small right pleural effusion. CT scan of the chest is
recommended for further evaluation.

## 2021-07-18 DEATH — deceased
